# Patient Record
Sex: Female | Born: 1970 | Race: Black or African American | Hispanic: No | Marital: Single | Smoking: Current every day smoker
Health system: Southern US, Community
[De-identification: ages and names within clinical notes are randomized; demographics above are authoritative.]

## PROBLEM LIST (undated history)

## (undated) DIAGNOSIS — I10 Essential (primary) hypertension: Secondary | ICD-10-CM

## (undated) DIAGNOSIS — K219 Gastro-esophageal reflux disease without esophagitis: Secondary | ICD-10-CM

## (undated) DIAGNOSIS — E611 Iron deficiency: Secondary | ICD-10-CM

## (undated) HISTORY — DX: Essential (primary) hypertension: I10

## (undated) HISTORY — DX: Iron deficiency: E61.1

## (undated) HISTORY — PX: ABDOMINAL HYSTERECTOMY: SHX81

## (undated) HISTORY — PX: LAPAROSCOPY: SHX197

## (undated) HISTORY — DX: Gastro-esophageal reflux disease without esophagitis: K21.9

---

## 1998-09-25 ENCOUNTER — Other Ambulatory Visit: Admission: RE | Admit: 1998-09-25 | Discharge: 1998-09-25 | Payer: Self-pay | Admitting: Gynecology

## 1998-12-19 ENCOUNTER — Inpatient Hospital Stay (HOSPITAL_COMMUNITY): Admission: RE | Admit: 1998-12-19 | Discharge: 1998-12-20 | Payer: Self-pay | Admitting: Gynecology

## 1999-09-09 ENCOUNTER — Emergency Department (HOSPITAL_COMMUNITY): Admission: EM | Admit: 1999-09-09 | Discharge: 1999-09-09 | Payer: Self-pay | Admitting: Emergency Medicine

## 1999-09-09 ENCOUNTER — Encounter: Payer: Self-pay | Admitting: Emergency Medicine

## 1999-10-13 ENCOUNTER — Other Ambulatory Visit: Admission: RE | Admit: 1999-10-13 | Discharge: 1999-10-13 | Payer: Self-pay | Admitting: Gynecology

## 2000-09-30 ENCOUNTER — Inpatient Hospital Stay: Admission: AD | Admit: 2000-09-30 | Discharge: 2000-09-30 | Payer: Self-pay | Admitting: Gynecology

## 2001-06-07 ENCOUNTER — Other Ambulatory Visit: Admission: RE | Admit: 2001-06-07 | Discharge: 2001-06-07 | Payer: Self-pay | Admitting: Obstetrics & Gynecology

## 2002-12-25 ENCOUNTER — Emergency Department (HOSPITAL_COMMUNITY): Admission: EM | Admit: 2002-12-25 | Discharge: 2002-12-25 | Payer: Self-pay | Admitting: Emergency Medicine

## 2003-04-26 ENCOUNTER — Encounter: Admission: RE | Admit: 2003-04-26 | Discharge: 2003-04-26 | Payer: Self-pay | Admitting: Family Medicine

## 2003-11-16 ENCOUNTER — Encounter: Admission: RE | Admit: 2003-11-16 | Discharge: 2003-11-16 | Payer: Self-pay | Admitting: Family Medicine

## 2003-11-26 ENCOUNTER — Inpatient Hospital Stay (HOSPITAL_COMMUNITY): Admission: AD | Admit: 2003-11-26 | Discharge: 2003-11-26 | Payer: Self-pay | Admitting: Family Medicine

## 2004-01-31 ENCOUNTER — Encounter: Admission: RE | Admit: 2004-01-31 | Discharge: 2004-01-31 | Payer: Self-pay | Admitting: Obstetrics and Gynecology

## 2004-02-21 ENCOUNTER — Ambulatory Visit (HOSPITAL_COMMUNITY): Admission: RE | Admit: 2004-02-21 | Discharge: 2004-02-21 | Payer: Self-pay | Admitting: Obstetrics and Gynecology

## 2004-12-09 ENCOUNTER — Other Ambulatory Visit: Admission: RE | Admit: 2004-12-09 | Discharge: 2004-12-09 | Payer: Self-pay | Admitting: Obstetrics and Gynecology

## 2004-12-30 ENCOUNTER — Encounter: Admission: RE | Admit: 2004-12-30 | Discharge: 2004-12-30 | Payer: Self-pay | Admitting: Obstetrics and Gynecology

## 2005-01-23 ENCOUNTER — Inpatient Hospital Stay (HOSPITAL_COMMUNITY): Admission: AD | Admit: 2005-01-23 | Discharge: 2005-01-24 | Payer: Self-pay | Admitting: Obstetrics and Gynecology

## 2007-01-26 ENCOUNTER — Ambulatory Visit: Payer: Self-pay | Admitting: Gynecology

## 2007-03-10 ENCOUNTER — Ambulatory Visit: Payer: Self-pay | Admitting: Internal Medicine

## 2007-11-05 ENCOUNTER — Emergency Department (HOSPITAL_COMMUNITY): Admission: EM | Admit: 2007-11-05 | Discharge: 2007-11-05 | Payer: Self-pay | Admitting: Emergency Medicine

## 2007-11-28 ENCOUNTER — Ambulatory Visit: Payer: Self-pay | Admitting: *Deleted

## 2008-01-10 ENCOUNTER — Ambulatory Visit (HOSPITAL_COMMUNITY): Admission: RE | Admit: 2008-01-10 | Discharge: 2008-01-10 | Payer: Self-pay | Admitting: Obstetrics

## 2008-05-31 ENCOUNTER — Inpatient Hospital Stay (HOSPITAL_COMMUNITY): Admission: RE | Admit: 2008-05-31 | Discharge: 2008-06-03 | Payer: Self-pay | Admitting: Obstetrics & Gynecology

## 2008-05-31 ENCOUNTER — Encounter: Payer: Self-pay | Admitting: Obstetrics & Gynecology

## 2008-07-29 ENCOUNTER — Emergency Department (HOSPITAL_COMMUNITY): Admission: EM | Admit: 2008-07-29 | Discharge: 2008-07-29 | Payer: Self-pay | Admitting: Emergency Medicine

## 2008-10-05 ENCOUNTER — Emergency Department (HOSPITAL_COMMUNITY): Admission: EM | Admit: 2008-10-05 | Discharge: 2008-10-06 | Payer: Self-pay | Admitting: Emergency Medicine

## 2009-10-16 ENCOUNTER — Ambulatory Visit: Payer: Self-pay | Admitting: Obstetrics and Gynecology

## 2009-11-15 ENCOUNTER — Ambulatory Visit (HOSPITAL_COMMUNITY): Admission: RE | Admit: 2009-11-15 | Discharge: 2009-11-15 | Payer: Self-pay | Admitting: Obstetrics & Gynecology

## 2009-11-24 ENCOUNTER — Emergency Department (HOSPITAL_COMMUNITY): Admission: EM | Admit: 2009-11-24 | Discharge: 2009-11-24 | Payer: Self-pay | Admitting: Emergency Medicine

## 2010-01-01 ENCOUNTER — Emergency Department (HOSPITAL_COMMUNITY): Admission: EM | Admit: 2010-01-01 | Discharge: 2010-01-02 | Payer: Self-pay | Admitting: Emergency Medicine

## 2010-02-20 ENCOUNTER — Ambulatory Visit: Payer: Self-pay | Admitting: Internal Medicine

## 2010-02-21 ENCOUNTER — Ambulatory Visit: Payer: Self-pay | Admitting: Internal Medicine

## 2010-02-24 ENCOUNTER — Ambulatory Visit: Payer: Self-pay | Admitting: Internal Medicine

## 2010-02-26 ENCOUNTER — Ambulatory Visit: Payer: Self-pay | Admitting: Internal Medicine

## 2010-04-02 IMAGING — CR DG CHEST 2V
2 series · 2 of 2 positions shown · non-contrast
Comparison: 10/05/2008

CLINICAL DATA: Chest pain.  History of hypertension and smoking.

CHEST - 2 VIEW

[w chest pa]
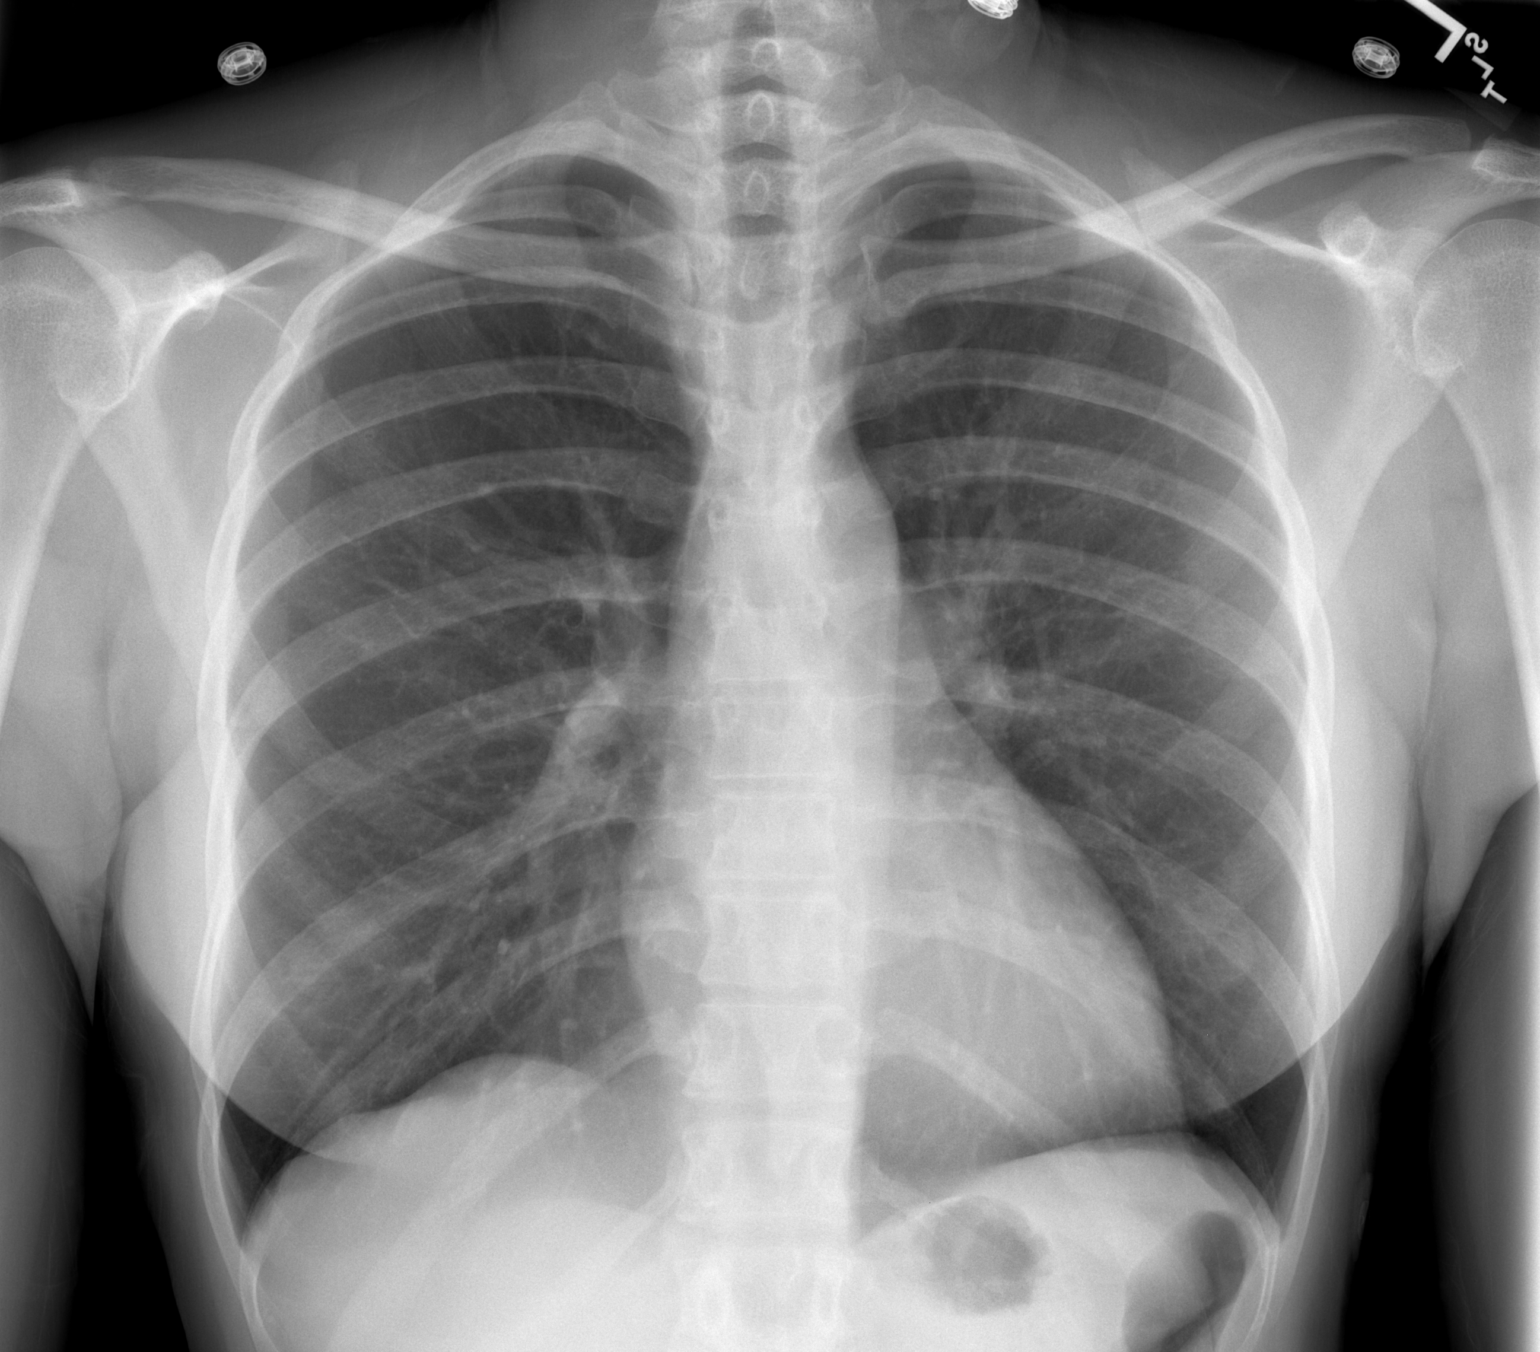

[w chest lat]
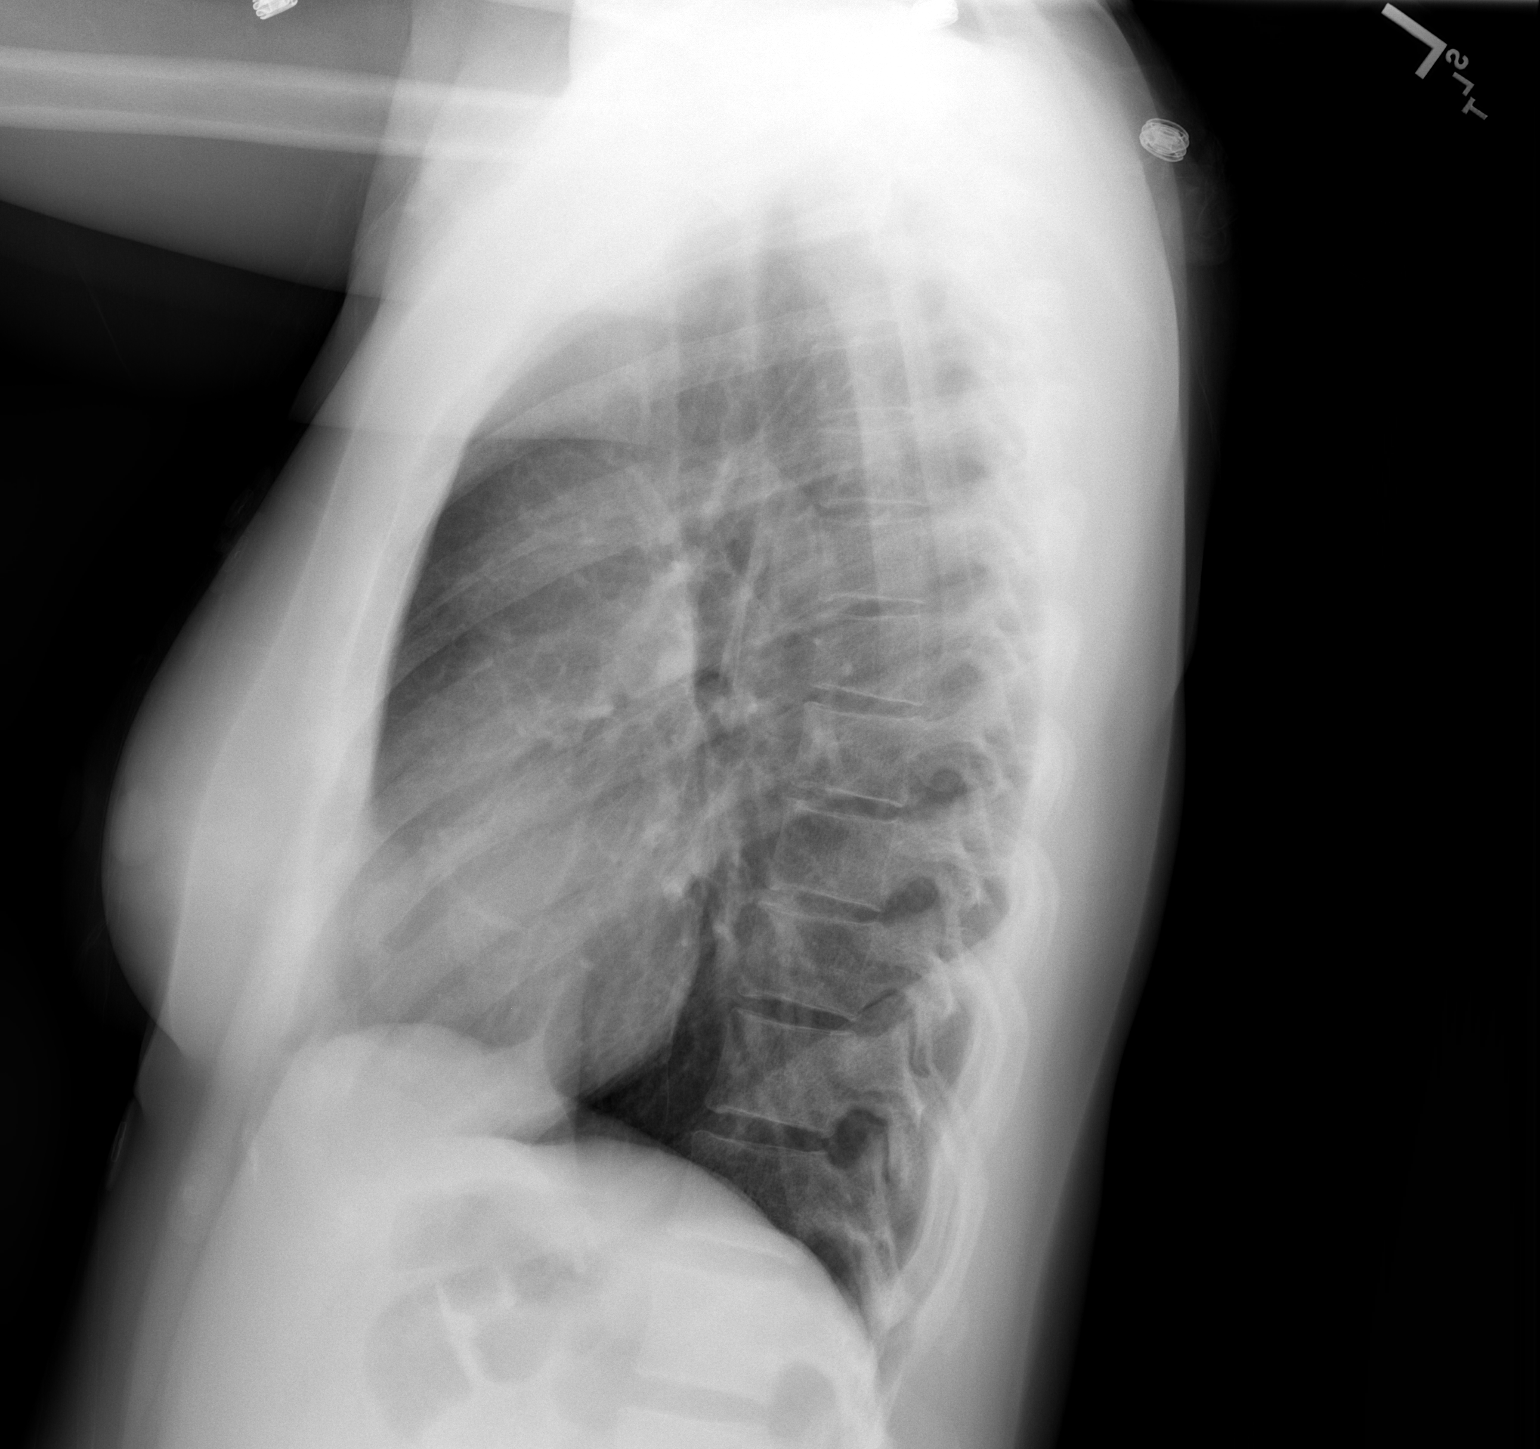

[2 of 2 positions shown; findings below may reference images not displayed]

FINDINGS: Cardiomediastinal silhouette is within normal limits.
The lungs are free of focal consolidations and pleural effusions.
Bony structures have a normal appearance.
IMPRESSION: Negative exam.

## 2010-04-28 ENCOUNTER — Ambulatory Visit: Payer: Self-pay | Admitting: Internal Medicine

## 2010-06-20 ENCOUNTER — Ambulatory Visit: Payer: Self-pay | Admitting: Internal Medicine

## 2010-09-11 ENCOUNTER — Encounter (INDEPENDENT_AMBULATORY_CARE_PROVIDER_SITE_OTHER): Payer: Self-pay | Admitting: Internal Medicine

## 2010-09-11 LAB — CONVERTED CEMR LAB
BUN: 14 mg/dL (ref 6–23)
Basophils Absolute: 0 10*3/uL (ref 0.0–0.1)
Basophils Relative: 0 % (ref 0–1)
CO2: 23 meq/L (ref 19–32)
Calcium: 9.9 mg/dL (ref 8.4–10.5)
Chloride: 103 meq/L (ref 96–112)
Creatinine, Ser: 0.62 mg/dL (ref 0.40–1.20)
Eosinophils Absolute: 0.1 10*3/uL (ref 0.0–0.7)
Eosinophils Relative: 2 % (ref 0–5)
Glucose, Bld: 104 mg/dL — ABNORMAL HIGH (ref 70–99)
HCT: 45.4 % (ref 36.0–46.0)
HIV: NONREACTIVE
Hemoglobin: 14.8 g/dL (ref 12.0–15.0)
Lymphocytes Relative: 32 % (ref 12–46)
Lymphs Abs: 2.4 10*3/uL (ref 0.7–4.0)
MCHC: 32.6 g/dL (ref 30.0–36.0)
MCV: 91.5 fL (ref 78.0–100.0)
Microalb, Ur: 1.89 mg/dL (ref 0.00–1.89)
Monocytes Absolute: 0.6 10*3/uL (ref 0.1–1.0)
Monocytes Relative: 8 % (ref 3–12)
Neutro Abs: 4.5 10*3/uL (ref 1.7–7.7)
Neutrophils Relative %: 59 % (ref 43–77)
Platelets: 235 10*3/uL (ref 150–400)
Potassium: 4.2 meq/L (ref 3.5–5.3)
RBC: 4.96 M/uL (ref 3.87–5.11)
RDW: 14.3 % (ref 11.5–15.5)
Sodium: 141 meq/L (ref 135–145)
WBC: 7.6 10*3/uL (ref 4.0–10.5)

## 2010-11-27 ENCOUNTER — Encounter (INDEPENDENT_AMBULATORY_CARE_PROVIDER_SITE_OTHER): Payer: Self-pay | Admitting: Family Medicine

## 2010-11-27 LAB — CONVERTED CEMR LAB
ALT: 33 units/L (ref 0–35)
AST: 29 units/L (ref 0–37)
Albumin: 4.7 g/dL (ref 3.5–5.2)
Alkaline Phosphatase: 115 units/L (ref 39–117)
BUN: 13 mg/dL (ref 6–23)
CO2: 28 meq/L (ref 19–32)
Calcium: 10.1 mg/dL (ref 8.4–10.5)
Chloride: 103 meq/L (ref 96–112)
Cholesterol: 237 mg/dL — ABNORMAL HIGH (ref 0–200)
Creatinine, Ser: 0.87 mg/dL (ref 0.40–1.20)
Glucose, Bld: 90 mg/dL (ref 70–99)
HDL: 53 mg/dL (ref >39)
LDL Cholesterol: 142 mg/dL — ABNORMAL HIGH (ref 0–99)
Potassium: 3.9 meq/L (ref 3.5–5.3)
Sodium: 140 meq/L (ref 135–145)
TSH: 2.743 microintl units/mL (ref 0.350–4.500)
Total Bilirubin: 0.4 mg/dL (ref 0.3–1.2)
Total CHOL/HDL Ratio: 4.5
Total Protein: 7.8 g/dL (ref 6.0–8.3)
Triglycerides: 208 mg/dL — ABNORMAL HIGH (ref <150)
VLDL: 42 mg/dL — ABNORMAL HIGH (ref 0–40)

## 2010-12-01 ENCOUNTER — Encounter: Payer: Self-pay | Admitting: Family Medicine

## 2010-12-01 ENCOUNTER — Ambulatory Visit (HOSPITAL_COMMUNITY)
Admission: RE | Admit: 2010-12-01 | Discharge: 2010-12-01 | Payer: Self-pay | Source: Home / Self Care | Attending: Family Medicine | Admitting: Family Medicine

## 2011-01-25 LAB — POCT I-STAT, CHEM 8
BUN: 6 mg/dL (ref 6–23)
Calcium, Ion: 1.15 mmol/L (ref 1.12–1.32)
Chloride: 105 mEq/L (ref 96–112)
Creatinine, Ser: 0.8 mg/dL (ref 0.4–1.2)
Glucose, Bld: 106 mg/dL — ABNORMAL HIGH (ref 70–99)
HCT: 47 % — ABNORMAL HIGH (ref 36.0–46.0)
Hemoglobin: 16 g/dL — ABNORMAL HIGH (ref 12.0–15.0)
Potassium: 3.8 mEq/L (ref 3.5–5.1)
Sodium: 136 mEq/L (ref 135–145)
TCO2: 24 mmol/L (ref 0–100)

## 2011-01-25 LAB — URINALYSIS, ROUTINE W REFLEX MICROSCOPIC
Bilirubin Urine: NEGATIVE
Glucose, UA: NEGATIVE mg/dL
Hgb urine dipstick: NEGATIVE
Ketones, ur: NEGATIVE mg/dL
Nitrite: NEGATIVE
Protein, ur: NEGATIVE mg/dL
Specific Gravity, Urine: 1.016 (ref 1.005–1.030)
Urobilinogen, UA: 0.2 mg/dL (ref 0.0–1.0)
pH: 5.5 (ref 5.0–8.0)

## 2011-01-25 LAB — POCT PREGNANCY, URINE: Preg Test, Ur: NEGATIVE

## 2011-01-30 LAB — BASIC METABOLIC PANEL
BUN: 11 mg/dL (ref 6–23)
CO2: 23 mEq/L (ref 19–32)
Calcium: 9.4 mg/dL (ref 8.4–10.5)
Chloride: 98 mEq/L (ref 96–112)
Creatinine, Ser: 0.7 mg/dL (ref 0.4–1.2)
GFR calc Af Amer: >60 mL/min (ref >60)
GFR calc non Af Amer: >60 mL/min (ref >60)
Glucose, Bld: 123 mg/dL — ABNORMAL HIGH (ref 70–99)
Potassium: 3.4 mEq/L — ABNORMAL LOW (ref 3.5–5.1)
Sodium: 129 mEq/L — ABNORMAL LOW (ref 135–145)

## 2011-01-30 LAB — POCT CARDIAC MARKERS
CKMB, poc: <1 ng/mL — ABNORMAL LOW (ref 1.0–8.0)
Myoglobin, poc: 38 ng/mL (ref 12–200)
Troponin i, poc: <0.05 ng/mL (ref 0.00–0.09)

## 2011-01-30 LAB — CBC
HCT: 45 % (ref 36.0–46.0)
Hemoglobin: 15.2 g/dL — ABNORMAL HIGH (ref 12.0–15.0)
MCHC: 33.8 g/dL (ref 30.0–36.0)
MCV: 88.8 fL (ref 78.0–100.0)
Platelets: 188 10*3/uL (ref 150–400)
RBC: 5.06 MIL/uL (ref 3.87–5.11)
RDW: 14.5 % (ref 11.5–15.5)
WBC: 4.4 10*3/uL (ref 4.0–10.5)

## 2011-01-30 LAB — DIFFERENTIAL
Basophils Absolute: 0 10*3/uL (ref 0.0–0.1)
Basophils Relative: 0 % (ref 0–1)
Eosinophils Absolute: 0 10*3/uL (ref 0.0–0.7)
Eosinophils Relative: 1 % (ref 0–5)
Lymphocytes Relative: 25 % (ref 12–46)
Lymphs Abs: 1.1 10*3/uL (ref 0.7–4.0)
Monocytes Absolute: 0.4 10*3/uL (ref 0.1–1.0)
Monocytes Relative: 8 % (ref 3–12)
Neutro Abs: 2.9 10*3/uL (ref 1.7–7.7)
Neutrophils Relative %: 66 % (ref 43–77)

## 2011-03-04 ENCOUNTER — Emergency Department (HOSPITAL_COMMUNITY): Payer: Self-pay

## 2011-03-04 ENCOUNTER — Emergency Department (HOSPITAL_COMMUNITY)
Admission: EM | Admit: 2011-03-04 | Discharge: 2011-03-04 | Disposition: A | Discharge status: Home / Self Care | Payer: Self-pay | Attending: Emergency Medicine | Admitting: Emergency Medicine

## 2011-03-04 DIAGNOSIS — R51 Headache: Secondary | ICD-10-CM | POA: Insufficient documentation

## 2011-03-04 DIAGNOSIS — E876 Hypokalemia: Secondary | ICD-10-CM | POA: Insufficient documentation

## 2011-03-04 DIAGNOSIS — M79609 Pain in unspecified limb: Secondary | ICD-10-CM | POA: Insufficient documentation

## 2011-03-04 DIAGNOSIS — I10 Essential (primary) hypertension: Secondary | ICD-10-CM | POA: Insufficient documentation

## 2011-03-04 DIAGNOSIS — R110 Nausea: Secondary | ICD-10-CM | POA: Insufficient documentation

## 2011-03-04 DIAGNOSIS — M542 Cervicalgia: Secondary | ICD-10-CM | POA: Insufficient documentation

## 2011-03-04 DIAGNOSIS — H538 Other visual disturbances: Secondary | ICD-10-CM | POA: Insufficient documentation

## 2011-03-04 LAB — CBC
HCT: 42.2 % (ref 36.0–46.0)
Hemoglobin: 14 g/dL (ref 12.0–15.0)
MCH: 28.2 pg (ref 26.0–34.0)
MCHC: 33.2 g/dL (ref 30.0–36.0)
MCV: 84.9 fL (ref 78.0–100.0)
Platelets: 204 10*3/uL (ref 150–400)
RBC: 4.97 MIL/uL (ref 3.87–5.11)
RDW: 13.7 % (ref 11.5–15.5)
WBC: 5 10*3/uL (ref 4.0–10.5)

## 2011-03-04 LAB — BASIC METABOLIC PANEL
BUN: 12 mg/dL (ref 6–23)
CO2: 27 mEq/L (ref 19–32)
Calcium: 9.1 mg/dL (ref 8.4–10.5)
Chloride: 97 mEq/L (ref 96–112)
Creatinine, Ser: 0.8 mg/dL (ref 0.4–1.2)
GFR calc Af Amer: >60 mL/min (ref >60)
GFR calc non Af Amer: >60 mL/min (ref >60)
Glucose, Bld: 138 mg/dL — ABNORMAL HIGH (ref 70–99)
Potassium: 2.9 mEq/L — ABNORMAL LOW (ref 3.5–5.1)
Sodium: 133 mEq/L — ABNORMAL LOW (ref 135–145)

## 2011-03-04 LAB — SEDIMENTATION RATE: Sed Rate: 13 mm/hr (ref 0–22)

## 2011-03-04 LAB — DIFFERENTIAL
Basophils Absolute: 0 10*3/uL (ref 0.0–0.1)
Basophils Relative: 0 % (ref 0–1)
Eosinophils Absolute: 0 10*3/uL (ref 0.0–0.7)
Eosinophils Relative: 0 % (ref 0–5)
Lymphocytes Relative: 34 % (ref 12–46)
Lymphs Abs: 1.7 10*3/uL (ref 0.7–4.0)
Monocytes Absolute: 0.5 10*3/uL (ref 0.1–1.0)
Monocytes Relative: 9 % (ref 3–12)
Neutro Abs: 2.9 10*3/uL (ref 1.7–7.7)
Neutrophils Relative %: 57 % (ref 43–77)

## 2011-03-04 LAB — CK TOTAL AND CKMB (NOT AT ARMC)
CK, MB: 0.9 ng/mL (ref 0.3–4.0)
Relative Index: 0.8 (ref 0.0–2.5)
Total CK: 119 U/L (ref 7–177)

## 2011-03-04 LAB — D-DIMER, QUANTITATIVE: D-Dimer, Quant: 0.74 ug/mL-FEU — ABNORMAL HIGH (ref 0.00–0.48)

## 2011-03-05 ENCOUNTER — Ambulatory Visit (HOSPITAL_COMMUNITY)
Admission: RE | Admit: 2011-03-05 | Discharge: 2011-03-05 | Disposition: A | Discharge status: Home / Self Care | Payer: Self-pay | Source: Ambulatory Visit | Attending: Emergency Medicine | Admitting: Emergency Medicine

## 2011-03-05 DIAGNOSIS — M79609 Pain in unspecified limb: Secondary | ICD-10-CM | POA: Insufficient documentation

## 2011-03-05 DIAGNOSIS — M7989 Other specified soft tissue disorders: Secondary | ICD-10-CM | POA: Insufficient documentation

## 2011-03-24 NOTE — H&P (Signed)
NAME:  Kaylee Allison, ROEHRICH NO.:  [id number]   MEDICAL RECORD NO.:  [id number]          PATIENT TYPE:  INO   LOCATION:                                [facility]   PHYSICIAN:  Roseanna Rainbow, M.D.DATE OF BIRTH:  14-Jun-1971   DATE OF ADMISSION:  05/31/2008  DATE OF DISCHARGE:                              HISTORY & PHYSICAL   CHIEF COMPLAINT:  The patient is a 40 year old with symptomatic uterine  fibroids and bilateral hydrosalpinges who presents for a total abdominal  hysterectomy and bilateral salpingectomies.   HISTORY OF PRESENT ILLNESS:  The patient has a long history of  symptomatic uterine fibroids.  She has a history of multiple abdominal  myomectomies.  She has heavy menses and secondary dysmenorrhea.  She  also has mild-to-moderate secondary iron-deficiency anemia.  Hemoglobin  in February 2009 was 9.8.  Workup to date has included a normal TSH.  Pap smear in February 2009 was negative.  Pelvic ultrasound performed in  March 2009; this demonstrated a uterus measuring approximately 14.9 cm  in length.  There were numerous fibroids.  The endometrial stripe did  not appear grossly thickened.  The largest fibroids included a broad-  based subserosal myoma at the fundus and a lower uterine segment myoma  to the left of the midline.  Bilateral hydrosalpinges were noted as  well.   PAST MEDICAL HISTORY:  1. Hyperthyroidism.  2. Iron-deficiency anemia.  3. Chronic hypertension.   PAST SURGICAL HISTORY:  Please see the above.   SOCIAL HISTORY:  She is single, living with her significant other.  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  She drinks a moderate amount of alcoholic beverages.  Denies  illicit drug use.   FAMILY HISTORY:  Congestive heart failure, adult-onset diabetes,  hypertension, kidney disease, and migraine headaches.   PAST GYN HISTORY:  Please see the above.   ALLERGIES:   CODEINE.   PHYSICAL EXAMINATION:  VITAL SIGNS:  Temperature 98.7, pulse 96, blood  pressure 139/78, weight 114 pounds, and height 5 feet 6 inches.  GENERAL:  Well developed, well nourished, and in no apparent distress.  LUNGS:  Clear to auscultation bilaterally.  HEART:  Regular rate and rhythm.  ABDOMEN:  There is a mass arising from the pelvis to approximately 3  fingerbreadths below the umbilicus, it is mobile.  PELVIC:  Normal EGBUS.  The cervix is without lesions.  The bimanual  exam is limited by the markedly enlarged uterus.   ASSESSMENT:  Symptomatic uterine fibroids, bilateral hydrosalpinges.   PLAN:  The planned procedure is total abdominal hysterectomy and  bilateral salpingectomies.  The risks, benefits, and alternative forms  of management have been reviewed with the patient and informed consent  had been obtained.      Roseanna Rainbow, M.D.  Electronically Signed     LAJ/MEDQ  D:  05/30/2008  T:  05/31/2008  Job:  [id number]

## 2011-03-24 NOTE — Op Note (Signed)
NAME:  Kaylee Allison, Kaylee Allison NO.:  [id number]   MEDICAL RECORD NO.:  [id number]          PATIENT TYPE:  INP   LOCATION:  9302                          [facility]ACILITY:  [facility]   PHYSICIAN:  Roseanna Rainbow, M.D.DATE OF BIRTH:  1971/01/28   DATE OF PROCEDURE:  05/31/2008  DATE OF DISCHARGE:                               OPERATIVE REPORT   PREOPERATIVE DIAGNOSES:  1. Symptomatic uterine fibroids.  2. Bilateral hydrosalpinges.   POSTOPERATIVE DIAGNOSES:  1. Symptomatic uterine fibroids.  2. Bilateral endometriomas.  3. Bilateral hematosalpinges.  4. Adhesions.   PROCEDURES:  Total abdominal hysterectomy, bilateral salpingo-  oophorectomy, and lysis of adhesions.   SURGEONS:  1. Roseanna Rainbow, MD  2. Charles A. Clearance Coots, MD   ANESTHESIA:  General endotracheal.   PATHOLOGY:  Uterus, cervix, tubes, and ovaries.   ESTIMATED BLOOD LOSS:  1 liter.   COMPLICATIONS:  Increased blood loss.   FINDINGS:  The uterus was diffusely enlarged to approximately 16 cm in  length with irregular contour.  There were adhesions involving both the  anterior and posterior cul-de-sac.  The ovaries and tubes were involved  in adhesions bilaterally.  Both ovaries had chocolate cysts that were  ruptured during the procedure.  The tubes had blood-tinged fluid as  well.  The appendix was involved in adhesions to the right adnexa.   PROCEDURE:  The patient was taken to the operating room with an IV  running.  She was given general anesthesia and placed in a dorsal  lithotomy position.  She was then prepped and draped in the usual  sterile fashion.  After a time-out had been completed, the previous  Pfannenstiel incision scar was then incised with a scalpel and carried  down to the underlying fascia.  The fascia was nicked in the midline.  The fascial incision was then extended bilaterally.  The superior aspect  of the fascial incision was tented up and the underlying rectus muscles  dissected off.  The inferior aspect of the fascial incision was  manipulated in a similar fashion.  The rectus muscles were separated in  the midline.  The parietal peritoneum was tented up and entered sharply.  This incision was then extended superiorly and inferiorly with good  visualization of the bladder.  There were omental adhesions involving  the parietal peritoneum of the anterior abdominal wall.  These were  divided with cautery.  There were also omental adhesions to the serosa  overlying the fundal myomas.  This portion of the omentum was  sequentially clamped with Kelly clamps and divided.  The pedicles were  secured with 0-Vicryl ties.  The adhesions involving the anterior cul-de-  sac were then sharply and bluntly dissected.  The bowel was then packed  away and Deavers were then placed into the incision for retraction.  The  round ligaments on both sides were then divided with cautery.  The broad  ligament was then transected to the midline using the Bovie, and the  bladder was then dissected off the lower uterine segment using both  sharp and blunt dissection.  The utero-ovarian ligaments and the  fallopian tubes were then doubly clamped bilaterally and transected.  The pedicles were then secured with free ligatures of 0-Vicryl.  The  uterine vessels were then skeletonized bilaterally.  The vessels were  then clamped with parametrial clamps.  The pedicles were then secured  with suture ligatures of 0-Vicryl.  The uterine fundus was then  amputated at this point above the level of the uterine pedicles.  At  this point, an O'Connor-O'Sullivan retractor was then placed into the  incision and the bowel was repacked with moistened laparotomy sponges.  The infundibulopelvic ligaments on both sides were then skeletonized.  The ureter was visualized prior to clamping the infundibulopelvic  ligaments.  The pedicles were doubly clamped and transected, and both  free ligature and suture  ligatures were used to secure the pedicles.  The adhesions noted above involving the appendix to the right adnexa  were divided using cautery.  The appendix was not felt to be involved in  endometriosis.  please note that the remainder of the peritoneal  surfaces appeared free of any active endometriotic lesions.  There were  adhesions to the parietal peritoneum of the pelvis involving the ovaries  and tubes, and these adhesions were carefully sharply dissected taking  care to avoid the ureter.  The cardinal ligaments were then clamped with  parametrial clamps, transected, and secured with suture ligatures.  The  cervix was then amputated.  The vaginal angles were then secured with  suture ligatures of 0-Vicryl.  The remainder of the vaginal cuff was  reapproximated with figure-of-eight sutures of 0-Vicryl.  Adequate  hemostasis was noted.  At this point in the procedure, there was  hematuria noted.  The bladder was then filled with sterile milk, and  there was no leakage of the milk noted.  The pelvis was copiously  irrigated.  Seprafilm was then placed into the pelvis.  The retractors,  instruments, and lap pads were then removed.  The parietal peritoneum  was then reapproximated in a running fashion using 2-0 Vicryl.  The  fascia was closed with running sutures of 0-PDS.  The skin was closed in  a subcuticular fashion with 3-0 Monocryl.  At the closure of the  procedure, the instrument and pack counts were said to be correct x2.  The patient was taken to the PACU, awake, and in stable condition.      Roseanna Rainbow, M.D.  Electronically Signed     LAJ/MEDQ  D:  05/31/2008  T:  06/01/2008  Job:  [id number]

## 2011-03-27 NOTE — Discharge Summary (Signed)
NAME:  Kaylee Allison, Kaylee Allison NO.:  [id number]   MEDICAL RECORD NO.:  [id number]          PATIENT TYPE:  INP   LOCATION:  9302                          [facility]ACILITY:  [facility]   PHYSICIAN:  Roseanna Rainbow, M.D.DATE OF BIRTH:  [birth year]   DATE OF ADMISSION:  05/31/2008  DATE OF DISCHARGE:  06/03/2008                               DISCHARGE SUMMARY   CHIEF COMPLAINT:  The patient is a 40 year old with symptomatic uterine  fibroids and bilateral hydrosalpinges who presents for a total abdominal  hysterectomy and bilateral salpingectomies.  Please see the dictated  history and physical for further details.   HOSPITAL COURSE:  The patient was admitted.  She underwent a total  abdominal hysterectomy, bilateral salpingo-oophorectomy, and lysis of  adhesions.  Please see the see the dictated operative summary.  On  postoperative day #1, her hemoglobin was 9.3 and on postoperative day #2  she was complaining of left lateral thigh numbness.  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  The remainder of her hospital course was uneventful.  She  was discharged to home on postoperative day #3 tolerating a regular  diet.   DISCHARGE DIAGNOSES:  Leiomyomata, endometriosis, and adhesions.   PROCEDURES:  Total abdominal hysterectomy, bilateral salpingo-  oophorectomy, and lysis of adhesions.   CONDITION:  Stable.   DIET:  Regular.   ACTIVITY:  Progressive activity, pelvic rest.   MEDICATIONS:  Please see the reconciliation form.   DISPOSITION:  The patient was to follow up in the office in 2 weeks.      Roseanna Rainbow, M.D.  Electronically Signed     LAJ/MEDQ  D:  06/22/2008  T:  06/23/2008  Job:  [id number]

## 2011-03-27 NOTE — Group Therapy Note (Signed)
NAME:  Kaylee Allison, Kaylee Allison NO.:  [id number]   MEDICAL RECORD NO.:  [id number]                   PATIENT TYPE:  OUT   LOCATION:  [facility]                           FACILITY:  [facility]   PHYSICIAN:  Tinnie Gens, MD                     DATE OF BIRTH:  02/04/1971   DATE OF SERVICE:  11/16/2003                                    CLINIC NOTE   CHIEF COMPLAINT:  Enlarged uterus.   HISTORY OF PRESENT ILLNESS:  The patient is a 40 year old G0 who has been  seen in this office previously for fibroid uterus.  Apparently her uterus  was felt to have grown in size to 14- to 16-week size and she was referred  again.  The patient continues to have complaints related to her fibroids  including heavy vaginal bleeding with periods as well as painful periods.  She has pain with intercourse.   Today she is also complaining of vaginal discharge, burning, and odor.  She  reports that this happens frequently during the month.  She does have  occasional condom use but she notes it mostly after her menses.  Her  previous ultrasound is reviewed, showed three fibroids, the largest of which  was about 4 cm wide and they were in various locations.   PHYSICAL EXAMINATION TODAY:  VITAL SIGNS:  Weight is 110, blood pressure  119/74.  GENERAL:  Well-developed, well-nourished black female in no acute distress.  She is thin.  GENITOURINARY:  Shows normal external female genitalia.  The vagina is  rugated.  There is no significant discharge.  Moderate amount of blood  there.  The cervix is angled downward.  There is a large anterior fibroid  that is probably 5-6 cm that is anterior and disrupting the normal uterine  contour.  There is also another fibroid that is probably 5 cm that can be  felt fundally, and another fibroid felt on the patient's left side.  The  uterus is approximately 14-week size today.  Wet prep showed occasional clue  cells, no Trich, no yeast.   IMPRESSION:  1.  Fibroid uterus.  2. Bacterial vaginitis.   PLAN:  Discussed all options with the patient again today.  Again, we are  going to try watchful waiting.  Will repeat her ultrasound and see what that  shows and probably repeat every 6 months to see how these are growing.  A  trial of Naprosyn for premenstrual symptoms as well as through menses.  Flagyl 500 b.i.d. for 7 days to treat her infection.  She has several  refills of this just in case she has recurrence.                                               Tinnie Gens, MD  TP/MEDQ  D:  11/16/2003  T:  11/16/2003  Job:  [id number]

## 2011-03-27 NOTE — Group Therapy Note (Signed)
NAME:  Kaylee Allison, DEMARIO NO.:  [id number]   MEDICAL RECORD NO.:  [id number]                   PATIENT TYPE:  OUT   LOCATION:  [facility]                           FACILITY:  [facility]   PHYSICIAN:  Tinnie Gens, MD                     DATE OF BIRTH:  [birth year]   DATE OF SERVICE:  01/31/2004                                    CLINIC NOTE   CHIEF COMPLAINT:  Follow-up.   HISTORY OF PRESENT ILLNESS:  The patient is a 40 year old G0 who has been  evaluated for fibroids.  It was felt that her fibroids had possibly grown in  size.  She continues to have some abnormal bleeding.  On her previous  ultrasound she was felt to have an ovarian cyst and she would like that  followed up.  The patient desires a repeat ultrasound to relook at her  fibroids as well as the ovarian cyst.  She still does not want any  significant treatment.  On exam, the uterus feels approximately the same  size as it did previously.  The same fibroids are noted.   IMPRESSION:  1. Fibroid uterus.  2. Right ovarian cyst.   PLAN:  Follow-up ultrasound.  The patient will return after this ultrasound  to discuss treatment options.                                               Tinnie Gens, MD    TP/MEDQ  D:  02/27/2004  T:  02/27/2004  Job:  [id number]

## 2011-04-01 ENCOUNTER — Encounter: Payer: Self-pay | Admitting: Cardiology

## 2011-04-02 ENCOUNTER — Encounter: Payer: Self-pay | Admitting: Cardiology

## 2011-04-02 ENCOUNTER — Ambulatory Visit (INDEPENDENT_AMBULATORY_CARE_PROVIDER_SITE_OTHER): Payer: Self-pay | Admitting: Cardiology

## 2011-04-02 VITALS — BP 128/81 | HR 87 | Resp 14 | Ht 64.0 in | Wt 142.0 lb

## 2011-04-02 DIAGNOSIS — Z720 Tobacco use: Secondary | ICD-10-CM | POA: Insufficient documentation

## 2011-04-02 DIAGNOSIS — F17200 Nicotine dependence, unspecified, uncomplicated: Secondary | ICD-10-CM

## 2011-04-02 DIAGNOSIS — R079 Chest pain, unspecified: Secondary | ICD-10-CM | POA: Insufficient documentation

## 2011-04-02 DIAGNOSIS — R072 Precordial pain: Secondary | ICD-10-CM

## 2011-04-02 NOTE — Assessment & Plan Note (Signed)
Patient counseled on discontinuing. 

## 2011-04-02 NOTE — Patient Instructions (Signed)
You are being scheduled for a Stress Echo test.  Please follow the instruction sheet given. Follow up will be based on the results of your testing. Please continue any medication you presently take.

## 2011-04-02 NOTE — Progress Notes (Signed)
HPI: 40 yo female with no prior cardiac history for evaluation of chest pain. Patient states that she has had intermittent chest pain for approximately one year. It is substernal and described as a pressure. It radiates to the back. It lasts 15 minutes and resolves spontaneously. There are no associated symptoms. It is not exertional, pleuritic, positional or related to food. It resolves spontaneously. She also has dyspnea on exertion and pedal edema. Because of the above we were asked to further evaluate.  Current Outpatient Prescriptions  Medication Sig Dispense Refill  . aspirin 81 MG tablet Take 81 mg by mouth daily.        . famotidine (PEPCID) 20 MG tablet Take 20 mg by mouth daily.        . hydrochlorothiazide 25 MG tablet Take 25 mg by mouth daily.        Marland Kitchen ibuprofen (ADVIL,MOTRIN) 600 MG tablet Take 600 mg by mouth as needed.        Marland Kitchen losartan (COZAAR) 50 MG tablet Take 50 mg by mouth daily.        . methocarbamol (ROBAXIN) 750 MG tablet Take 750 mg by mouth as needed.        . Omega-3 Fatty Acids (FISH OIL) 1000 MG CAPS 1 tab po qd       . traMADol-acetaminophen (ULTRACET) 37.5-325 MG per tablet Take 1 tablet by mouth every 6 (six) hours as needed.          Allergies  Allergen Reactions  . Codeine     Past Medical History  Diagnosis Date  . Iron deficiency   . Hypertension     chronic  . GERD (gastroesophageal reflux disease)     Past Surgical History  Procedure Date  . Abdominal hysterectomy   . Laparoscopy     History   Social History  . Marital Status: Single    Spouse Name: N/A    Number of Children: N/A  . Years of Education: N/A   Occupational History  .      Unemployed   Social History Main Topics  . Smoking status: Current Everyday Smoker    Types: Cigarettes  . Smokeless tobacco: Not on file  . Alcohol Use: Yes     2 beers per day  . Drug Use: Yes     Marijuana  . Sexually Active: Not on file   Other Topics Concern  . Not on file   Social  History Narrative  . No narrative on file    Family History  Problem Relation Age of Onset  . Cancer    . Diabetes    . Hypertension      ROS: no fevers or chills, productive cough, hemoptysis, dysphasia, odynophagia, melena, hematochezia, dysuria, hematuria, rash, seizure activity, orthopnea, PND, pedal edema, claudication. Remaining systems are negative.  Physical Exam: General:  Well developed/well nourished in NAD Skin warm/dry Patient not depressed No peripheral clubbing Back-normal HEENT-normal/normal eyelids Neck supple/normal carotid upstroke bilaterally; no bruits; no JVD; no thyromegaly chest - CTA/ normal expansion CV - RRR/normal S1 and S2; no murmurs, rubs or gallops;  PMI nondisplaced Abdomen -NT/ND, no HSM, no mass, + bowel sounds, no bruit 2+ femoral pulses, no bruits Ext-no edema, chords, 2+ DP Neuro-grossly nonfocal  ECG Sinus rhythm at a rate of 87. Axis normal. Nonspecific ST changes.

## 2011-04-02 NOTE — Assessment & Plan Note (Signed)
Symptoms atypical. Schedule stress echocardiogram both to rule out ischemia and quantify LV function.

## 2011-04-16 ENCOUNTER — Ambulatory Visit (HOSPITAL_COMMUNITY): Payer: Self-pay | Attending: Cardiology | Admitting: Radiology

## 2011-04-16 DIAGNOSIS — F17200 Nicotine dependence, unspecified, uncomplicated: Secondary | ICD-10-CM | POA: Insufficient documentation

## 2011-04-16 DIAGNOSIS — R0989 Other specified symptoms and signs involving the circulatory and respiratory systems: Secondary | ICD-10-CM | POA: Insufficient documentation

## 2011-04-16 DIAGNOSIS — R072 Precordial pain: Secondary | ICD-10-CM | POA: Insufficient documentation

## 2011-04-16 DIAGNOSIS — R5381 Other malaise: Secondary | ICD-10-CM | POA: Insufficient documentation

## 2011-04-16 DIAGNOSIS — R0609 Other forms of dyspnea: Secondary | ICD-10-CM | POA: Insufficient documentation

## 2011-04-16 DIAGNOSIS — M7989 Other specified soft tissue disorders: Secondary | ICD-10-CM | POA: Insufficient documentation

## 2011-08-07 LAB — BASIC METABOLIC PANEL
BUN: <1 — ABNORMAL LOW
CO2: 27
Calcium: 8.4
Chloride: 105
Creatinine, Ser: 0.61
GFR calc Af Amer: >60
GFR calc non Af Amer: >60
Glucose, Bld: 165 — ABNORMAL HIGH
Potassium: 3.8
Sodium: 135

## 2011-08-07 LAB — CBC
HCT: 24.5 — ABNORMAL LOW
HCT: 29 — ABNORMAL LOW
HCT: 38.7
Hemoglobin: 12.5
Hemoglobin: 7.9 — CL
Hemoglobin: 9.3 — ABNORMAL LOW
MCHC: 32.1
MCHC: 32.2
MCHC: 32.3
MCV: 86.9
MCV: 87.7
MCV: 88.2
Platelets: 142 — ABNORMAL LOW
Platelets: 182
Platelets: 222
RBC: 2.79 — ABNORMAL LOW
RBC: 3.29 — ABNORMAL LOW
RBC: 4.45
RDW: 16.7 — ABNORMAL HIGH
RDW: 16.8 — ABNORMAL HIGH
RDW: 17.5 — ABNORMAL HIGH
WBC: 10.8 — ABNORMAL HIGH
WBC: 14.8 — ABNORMAL HIGH
WBC: 6.6

## 2011-08-07 LAB — PREGNANCY, URINE: Preg Test, Ur: NEGATIVE

## 2011-08-07 LAB — TYPE AND SCREEN
ABO/RH(D): O POS
Antibody Screen: NEGATIVE

## 2011-08-07 LAB — ABO/RH: ABO/RH(D): O POS

## 2011-08-10 LAB — POCT I-STAT, CHEM 8
BUN: 11
Calcium, Ion: 1.19
Chloride: 103
Creatinine, Ser: 0.8
Glucose, Bld: 99
HCT: 45
Hemoglobin: 15.3 — ABNORMAL HIGH
Potassium: 4.7
Sodium: 135
TCO2: 27

## 2011-08-12 LAB — COMPREHENSIVE METABOLIC PANEL
ALT: 18
AST: 23
Albumin: 3.3 — ABNORMAL LOW
Alkaline Phosphatase: 74
BUN: 10
CO2: 25
Calcium: 8.9
Chloride: 110
Creatinine, Ser: 0.63
GFR calc Af Amer: >60
GFR calc non Af Amer: >60
Glucose, Bld: 86
Potassium: 3.9
Sodium: 139
Total Bilirubin: 0.3
Total Protein: 6.4

## 2011-08-12 LAB — URINALYSIS, ROUTINE W REFLEX MICROSCOPIC
Bilirubin Urine: NEGATIVE
Glucose, UA: NEGATIVE
Hgb urine dipstick: NEGATIVE
Ketones, ur: NEGATIVE
Nitrite: NEGATIVE
Protein, ur: NEGATIVE
Specific Gravity, Urine: 1.026
Urobilinogen, UA: 0.2
pH: 5.5

## 2011-08-12 LAB — DIFFERENTIAL
Basophils Absolute: 0
Basophils Relative: 0
Eosinophils Absolute: 0.1
Eosinophils Relative: 2
Lymphocytes Relative: 30
Lymphs Abs: 2
Monocytes Absolute: 0.9
Monocytes Relative: 13 — ABNORMAL HIGH
Neutro Abs: 3.7
Neutrophils Relative %: 55

## 2011-08-12 LAB — CBC
HCT: 37.2
Hemoglobin: 12.1
MCHC: 32.5
MCV: 87
Platelets: 222
RBC: 4.27
RDW: 20 — ABNORMAL HIGH
WBC: 6.8

## 2011-08-12 LAB — RETICULOCYTES
RBC.: 4.16
Retic Count, Absolute: 54.1
Retic Ct Pct: 1.3

## 2011-08-12 LAB — POCT CARDIAC MARKERS
CKMB, poc: 1.2
Myoglobin, poc: 33.9
Troponin i, poc: <0.05

## 2011-08-12 LAB — D-DIMER, QUANTITATIVE: D-Dimer, Quant: 0.23

## 2011-11-12 ENCOUNTER — Other Ambulatory Visit (HOSPITAL_COMMUNITY): Payer: Self-pay | Admitting: Family Medicine

## 2011-11-12 DIAGNOSIS — Z1231 Encounter for screening mammogram for malignant neoplasm of breast: Secondary | ICD-10-CM

## 2011-12-08 ENCOUNTER — Ambulatory Visit (HOSPITAL_COMMUNITY)
Admission: RE | Admit: 2011-12-08 | Discharge: 2011-12-08 | Disposition: A | Discharge status: Home / Self Care | Payer: Self-pay | Source: Ambulatory Visit | Attending: Family Medicine | Admitting: Family Medicine

## 2011-12-08 DIAGNOSIS — Z1231 Encounter for screening mammogram for malignant neoplasm of breast: Secondary | ICD-10-CM | POA: Insufficient documentation

## 2012-10-10 ENCOUNTER — Emergency Department (HOSPITAL_COMMUNITY)
Admission: EM | Admit: 2012-10-10 | Discharge: 2012-10-10 | Disposition: A | Discharge status: Home / Self Care | Payer: Self-pay | Source: Home / Self Care

## 2012-10-10 ENCOUNTER — Encounter (HOSPITAL_COMMUNITY): Payer: Self-pay

## 2012-10-10 DIAGNOSIS — K219 Gastro-esophageal reflux disease without esophagitis: Secondary | ICD-10-CM

## 2012-10-10 MED ORDER — CALCIUM CARBONATE-SIMETHICONE 500-125 MG PO CHEW: 1.0000 | CHEWABLE_TABLET | Freq: Three times a day (TID) | ORAL | Status: DC | PRN

## 2012-10-10 MED ORDER — LOSARTAN POTASSIUM 50 MG PO TABS: 50.0000 mg | ORAL_TABLET | Freq: Every day | ORAL | Status: DC

## 2012-10-10 MED ORDER — HYDROCHLOROTHIAZIDE 25 MG PO TABS: 25.0000 mg | ORAL_TABLET | Freq: Every day | ORAL | Status: DC

## 2012-10-10 MED ORDER — OMEPRAZOLE MAGNESIUM 20 MG PO TBEC: 20.0000 mg | DELAYED_RELEASE_TABLET | Freq: Every day | ORAL | Status: DC

## 2012-10-10 MED ORDER — ASPIRIN 81 MG PO TABS: 81.0000 mg | ORAL_TABLET | Freq: Every day | ORAL | Status: DC

## 2012-10-10 NOTE — ED Provider Notes (Signed)
History     CSN: [id number]  Arrival date & time 10/10/12  1518   First MD Initiated Contact with Patient 10/10/12 1530      Chief Complaint  Patient presents with  . Medication Refill  . Epigastric pan x 3 weeks         HPI  41 year old [place] American female with history of hypertension, active tobacco use, who presents for medication refill as well as symptoms of epigastric discomfort for about 3 weeks. She describes these symptoms as a burning pain over the epigastric area which is nonradiating and associated with being on empty stomach I am. Patient had history of GERD in the past and was taking famotidine with some relief. She denies any nausea or vomiting but does have some acid reflux. She informs taking off and on ibuprofen over-the-counter for her shoulder pain ( takes about 2 tablets every other day). Actively smokes 1 pack per day. She informs drinking  a couple of beers over the weekend only. Also ran out of her losartan about 4 weeks back.  needs refill for her aspirin and HCTZ as well. Eyes any chest pain, palpitations, shortness of breath, headache, blurry vision, fever, chills, bowel or urinary symptoms, weakness or numbness.  Past Medical History  Diagnosis Date  . Iron deficiency   . Hypertension     chronic  . GERD (gastroesophageal reflux disease)     Past Surgical History  Procedure Date  . Abdominal hysterectomy   . Laparoscopy     Family History  Problem Relation Age of Onset  . Cancer    . Diabetes    . Hypertension      History  Substance Use Topics  . Smoking status: Current Every Day Smoker ( 1PPD)    Types: Cigarettes  . Smokeless tobacco: Not on file  . Alcohol Use: Yes     : 2 beers per weekend    OB History    Grav Para Term Preterm Abortions TAB SAB Ect Mult Living                  Review of Systems] Denies Headache, blurry vision, eye pain, ear pain, fever  , chills Denies shortness of breath, cough, sore throat, hoarseness  of voice, Chest pain, palpitations, dyspnea on exertion Denies, nausea, diarrhea, vomiting, abdominal bloating, or urinary symptoms Denies muscle weakness, joint or back pain, tingling or numbness of extremities  Allergies  Codeine  Home Medications   Current Outpatient Rx  Name  Route  Sig  Dispense  Refill  . ASPIRIN 81 MG PO TABS   Oral   Take 1 tablet (81 mg total) by mouth daily.   30 tablet   5   . CALCIUM CARBONATE-SIMETHICONE 500-125 MG PO CHEW   Oral   Chew 1 tablet by mouth 3 (three) times daily as needed (indigestion).   30 each   0   . HYDROCHLOROTHIAZIDE 25 MG PO TABS   Oral   Take 1 tablet (25 mg total) by mouth daily.   30 tablet   5   . IBUPROFEN 600 MG PO TABS   Oral   Take 600 mg by mouth as needed.           Marland Kitchen LOSARTAN POTASSIUM 50 MG PO TABS   Oral   Take 1 tablet (50 mg total) by mouth daily.   30 tablet   5   . OMEPRAZOLE MAGNESIUM 20 MG PO TBEC   Oral  Take 1 tablet (20 mg total) by mouth daily.   30 tablet   0     BP 152/82  Pulse 79  Temp 98.9 F (37.2 C) (Oral)  Resp 19  SpO2 100%  Physical Exam Related female in no acute distress HEENT: No pallor, moist oral mucosa, no icterus Chest: Clear to auscultation bilaterally, no added sounds CVS: Normal S1 and S2, no murmurs Abdomen: Soft, mildly tender to palpation over epigastric area, nondistended, bowel sounds present Ext:: Warm, no edema CNS: AAO x3, nonfocal  ED Course  Procedures (including critical care time)  Labs Reviewed - No data to display No results found.   1. GERD (gastroesophageal reflux disease)    Symptoms are indicative of GERD symptoms which is worsened with active heavy smoking and OTC motrin use. Will prescribe a 1 month course of prilosec. Will also prescribe prn maalox. counseled Strongly on smoking cessation and minimize use off NSAIDs. Also counseled on avoiding  Alcohol. Follow up in 3 months   2. HTN Noted for elevated BP likely in the  setting of missing her medications. Will refill HCTZ and losartan. Check chem 7 and lipid panel. continue baby aspirin   3. Tobacco abuse  counseled strongly on smoking cessation. Will try the help line   Follow up in 3 months.  MDM          Eddie North, MD 10/10/12 [phone]

## 2012-10-10 NOTE — ED Notes (Signed)
Patient c/o abdominal discomfort x3 weeks. No diarrhea no vomiting  Patient also states needs medication refills

## 2012-10-13 ENCOUNTER — Emergency Department (HOSPITAL_COMMUNITY)
Admission: EM | Admit: 2012-10-13 | Discharge: 2012-10-13 | Discharge status: Absent without leave | Payer: No Typology Code available for payment source | Source: Home / Self Care

## 2012-10-14 ENCOUNTER — Emergency Department (HOSPITAL_COMMUNITY)
Admission: EM | Admit: 2012-10-14 | Discharge: 2012-10-14 | Disposition: A | Discharge status: Home / Self Care | Payer: No Typology Code available for payment source | Source: Home / Self Care

## 2012-10-14 LAB — LIPID PANEL
Cholesterol: 218 mg/dL — ABNORMAL HIGH (ref 0–200)
HDL: 55 mg/dL (ref >39)
LDL Cholesterol: 143 mg/dL — ABNORMAL HIGH (ref 0–99)
Total CHOL/HDL Ratio: 4 RATIO
Triglycerides: 101 mg/dL (ref <150)
VLDL: 20 mg/dL (ref 0–40)

## 2012-10-14 LAB — BASIC METABOLIC PANEL
BUN: 12 mg/dL (ref 6–23)
CO2: 30 mEq/L (ref 19–32)
Calcium: 10 mg/dL (ref 8.4–10.5)
Chloride: 101 mEq/L (ref 96–112)
Creatinine, Ser: 0.83 mg/dL (ref 0.50–1.10)
GFR calc Af Amer: >90 mL/min (ref >90)
GFR calc non Af Amer: 85 mL/min — ABNORMAL LOW (ref >90)
Glucose, Bld: 110 mg/dL — ABNORMAL HIGH (ref 70–99)
Potassium: 3.8 mEq/L (ref 3.5–5.1)
Sodium: 140 mEq/L (ref 135–145)

## 2012-10-14 NOTE — ED Notes (Signed)
Patient here for blood work

## 2012-12-26 ENCOUNTER — Emergency Department (HOSPITAL_COMMUNITY)
Admission: EM | Admit: 2012-12-26 | Discharge: 2012-12-26 | Disposition: A | Discharge status: Home / Self Care | Payer: No Typology Code available for payment source | Source: Home / Self Care

## 2013-02-13 ENCOUNTER — Emergency Department (HOSPITAL_COMMUNITY)
Admission: EM | Admit: 2013-02-13 | Discharge: 2013-02-13 | Disposition: A | Discharge status: Home / Self Care | Payer: Self-pay | Attending: Emergency Medicine | Admitting: Emergency Medicine

## 2013-02-13 ENCOUNTER — Encounter (HOSPITAL_COMMUNITY): Payer: Self-pay | Admitting: Emergency Medicine

## 2013-02-13 ENCOUNTER — Emergency Department (HOSPITAL_COMMUNITY): Payer: Self-pay

## 2013-02-13 DIAGNOSIS — Z7982 Long term (current) use of aspirin: Secondary | ICD-10-CM | POA: Insufficient documentation

## 2013-02-13 DIAGNOSIS — Z79899 Other long term (current) drug therapy: Secondary | ICD-10-CM | POA: Insufficient documentation

## 2013-02-13 DIAGNOSIS — Z8719 Personal history of other diseases of the digestive system: Secondary | ICD-10-CM | POA: Insufficient documentation

## 2013-02-13 DIAGNOSIS — F17200 Nicotine dependence, unspecified, uncomplicated: Secondary | ICD-10-CM | POA: Insufficient documentation

## 2013-02-13 DIAGNOSIS — Z3202 Encounter for pregnancy test, result negative: Secondary | ICD-10-CM | POA: Insufficient documentation

## 2013-02-13 DIAGNOSIS — I10 Essential (primary) hypertension: Secondary | ICD-10-CM | POA: Insufficient documentation

## 2013-02-13 DIAGNOSIS — R0789 Other chest pain: Secondary | ICD-10-CM | POA: Insufficient documentation

## 2013-02-13 DIAGNOSIS — R079 Chest pain, unspecified: Secondary | ICD-10-CM

## 2013-02-13 LAB — COMPREHENSIVE METABOLIC PANEL
ALT: 35 U/L (ref 0–35)
AST: 28 U/L (ref 0–37)
Albumin: 3.9 g/dL (ref 3.5–5.2)
Alkaline Phosphatase: 126 U/L — ABNORMAL HIGH (ref 39–117)
BUN: 12 mg/dL (ref 6–23)
CO2: 27 mEq/L (ref 19–32)
Calcium: 9.4 mg/dL (ref 8.4–10.5)
Chloride: 97 mEq/L (ref 96–112)
Creatinine, Ser: 0.75 mg/dL (ref 0.50–1.10)
GFR calc Af Amer: >90 mL/min (ref >90)
GFR calc non Af Amer: >90 mL/min (ref >90)
Glucose, Bld: 108 mg/dL — ABNORMAL HIGH (ref 70–99)
Potassium: 3.2 mEq/L — ABNORMAL LOW (ref 3.5–5.1)
Sodium: 134 mEq/L — ABNORMAL LOW (ref 135–145)
Total Bilirubin: 0.4 mg/dL (ref 0.3–1.2)
Total Protein: 8.1 g/dL (ref 6.0–8.3)

## 2013-02-13 LAB — CBC WITH DIFFERENTIAL/PLATELET
Basophils Absolute: 0 10*3/uL (ref 0.0–0.1)
Basophils Relative: 1 % (ref 0–1)
Eosinophils Absolute: 0.1 10*3/uL (ref 0.0–0.7)
Eosinophils Relative: 1 % (ref 0–5)
HCT: 43.1 % (ref 36.0–46.0)
Hemoglobin: 14.8 g/dL (ref 12.0–15.0)
Lymphocytes Relative: 38 % (ref 12–46)
Lymphs Abs: 3.3 10*3/uL (ref 0.7–4.0)
MCH: 29.1 pg (ref 26.0–34.0)
MCHC: 34.3 g/dL (ref 30.0–36.0)
MCV: 84.7 fL (ref 78.0–100.0)
Monocytes Absolute: 0.7 10*3/uL (ref 0.1–1.0)
Monocytes Relative: 8 % (ref 3–12)
Neutro Abs: 4.6 10*3/uL (ref 1.7–7.7)
Neutrophils Relative %: 53 % (ref 43–77)
Platelets: 268 10*3/uL (ref 150–400)
RBC: 5.09 MIL/uL (ref 3.87–5.11)
RDW: 13.5 % (ref 11.5–15.5)
WBC: 8.8 10*3/uL (ref 4.0–10.5)

## 2013-02-13 LAB — POCT I-STAT TROPONIN I: Troponin i, poc: 0 ng/mL (ref 0.00–0.08)

## 2013-02-13 LAB — POCT PREGNANCY, URINE: Preg Test, Ur: NEGATIVE

## 2013-02-13 MED ORDER — NAPROXEN 500 MG PO TABS: 500.0000 mg | ORAL_TABLET | Freq: Two times a day (BID) | ORAL | Status: DC

## 2013-02-13 MED ORDER — CYCLOBENZAPRINE HCL 10 MG PO TABS: 10.0000 mg | ORAL_TABLET | Freq: Two times a day (BID) | ORAL | Status: DC | PRN

## 2013-02-13 NOTE — ED Provider Notes (Signed)
History     CSN: [id number]  Arrival date & time 02/13/13  1615   First MD Initiated Contact with Patient 02/13/13 1849      Chief Complaint  Patient presents with  . Chest Pain    (Consider location/radiation/quality/duration/timing/severity/associated sxs/prior treatment) HPI.... intermittent right-sided chest pain for 2 days.  Symptoms are described as sharp lasting approximately 1 minute or less.  No radiation of pain.  No dyspnea, diaphoresis, nausea.   Nothing makes symptoms better or worse.  Severity is mild   Past Medical History  Diagnosis Date  . Iron deficiency   . Hypertension     chronic  . GERD (gastroesophageal reflux disease)     Past Surgical History  Procedure Laterality Date  . Abdominal hysterectomy    . Laparoscopy      Family History  Problem Relation Age of Onset  . Cancer    . Diabetes    . Hypertension      History  Substance Use Topics  . Smoking status: Current Every Day Smoker    Types: Cigarettes  . Smokeless tobacco: Not on file  . Alcohol Use: Yes     Comment: 2 beers per day    OB History   Grav Para Term Preterm Abortions TAB SAB Ect Mult Living                  Review of Systems  All other systems reviewed and are negative.    Allergies  Codeine  Home Medications   Current Outpatient Rx  Name  Route  Sig  Dispense  Refill  . aspirin 81 MG tablet   Oral   Take 1 tablet (81 mg total) by mouth daily.   30 tablet   5   . hydrochlorothiazide (HYDRODIURIL) 25 MG tablet   Oral   Take 1 tablet (25 mg total) by mouth daily.   30 tablet   5   . losartan (COZAAR) 50 MG tablet   Oral   Take 1 tablet (50 mg total) by mouth daily.   30 tablet   5     There were no vitals taken for this visit.  Physical Exam  Nursing note and vitals reviewed. Constitutional: She is oriented to person, place, and time. She appears well-developed and well-nourished.  HENT:  Head: Normocephalic and atraumatic.  Eyes:  Conjunctivae and EOM are normal. Pupils are equal, round, and reactive to light.  Neck: Normal range of motion. Neck supple.  Cardiovascular: Normal rate, regular rhythm and normal heart sounds.   Pulmonary/Chest: Effort normal and breath sounds normal.  Minimal tenderness to palpation right chest wall  Abdominal: Soft. Bowel sounds are normal.  Musculoskeletal: Normal range of motion.  Neurological: She is alert and oriented to person, place, and time.  Skin: Skin is warm and dry.  Psychiatric: She has a normal mood and affect.    ED Course  Procedures (including critical care time)  Labs Reviewed  COMPREHENSIVE METABOLIC PANEL - Abnormal; Notable for the following:    Sodium 134 (*)    Potassium 3.2 (*)    Glucose, Bld 108 (*)    Alkaline Phosphatase 126 (*)    All other components within normal limits  CBC WITH DIFFERENTIAL  POCT I-STAT TROPONIN I  POCT PREGNANCY, URINE   Dg Chest 2 View  02/13/2013  *RADIOLOGY REPORT*  Clinical Data: Chest pain  CHEST - 2 VIEW  Comparison: Chest x-ray of 01/01/2010  Findings: No active infiltrate or effusion  is seen.  Mediastinal contours are stable.  The heart is within upper limits of normal in size.  No bony abnormality is seen.  IMPRESSION: Stable chest x-ray.  No active lung disease.   Original Report Authenticated By: Dwyane Dee, M.D.      No diagnosis found.   Date: 02/13/2013  Rate:80  Rhythm: normal sinus rhythm  QRS Axis: normal  Intervals: normal  ST/T Wave abnormalities: normal  Conduction Disutrbances: none  Narrative Interpretation: unremarkable     MDM  Patient is low risk for acute coronary syndrome and/or pulmonary embolism. Screening test negative. Has primary care follow.  Rx Naprosyn 500 mg #20 and Flexeril 10 mg #20        Donnetta Hutching, MD 02/14/13 1002

## 2013-02-13 NOTE — ED Notes (Signed)
Pt c/o right sided CP into back x 2 days worse with cough

## 2013-09-20 ENCOUNTER — Emergency Department (HOSPITAL_COMMUNITY)
Admission: EM | Admit: 2013-09-20 | Discharge: 2013-09-21 | Disposition: A | Discharge status: Home / Self Care | Payer: Self-pay | Attending: Emergency Medicine | Admitting: Emergency Medicine

## 2013-09-20 ENCOUNTER — Encounter (HOSPITAL_COMMUNITY): Payer: Self-pay | Admitting: Emergency Medicine

## 2013-09-20 DIAGNOSIS — Z7982 Long term (current) use of aspirin: Secondary | ICD-10-CM | POA: Insufficient documentation

## 2013-09-20 DIAGNOSIS — S0990XA Unspecified injury of head, initial encounter: Secondary | ICD-10-CM | POA: Insufficient documentation

## 2013-09-20 DIAGNOSIS — R519 Headache, unspecified: Secondary | ICD-10-CM

## 2013-09-20 DIAGNOSIS — Z8639 Personal history of other endocrine, nutritional and metabolic disease: Secondary | ICD-10-CM | POA: Insufficient documentation

## 2013-09-20 DIAGNOSIS — Z8719 Personal history of other diseases of the digestive system: Secondary | ICD-10-CM | POA: Insufficient documentation

## 2013-09-20 DIAGNOSIS — Y9389 Activity, other specified: Secondary | ICD-10-CM | POA: Insufficient documentation

## 2013-09-20 DIAGNOSIS — F17200 Nicotine dependence, unspecified, uncomplicated: Secondary | ICD-10-CM | POA: Insufficient documentation

## 2013-09-20 DIAGNOSIS — R110 Nausea: Secondary | ICD-10-CM | POA: Insufficient documentation

## 2013-09-20 DIAGNOSIS — Y929 Unspecified place or not applicable: Secondary | ICD-10-CM | POA: Insufficient documentation

## 2013-09-20 DIAGNOSIS — W1809XA Striking against other object with subsequent fall, initial encounter: Secondary | ICD-10-CM | POA: Insufficient documentation

## 2013-09-20 DIAGNOSIS — Z79899 Other long term (current) drug therapy: Secondary | ICD-10-CM | POA: Insufficient documentation

## 2013-09-20 DIAGNOSIS — I10 Essential (primary) hypertension: Secondary | ICD-10-CM | POA: Insufficient documentation

## 2013-09-20 DIAGNOSIS — R55 Syncope and collapse: Secondary | ICD-10-CM | POA: Insufficient documentation

## 2013-09-20 DIAGNOSIS — Z862 Personal history of diseases of the blood and blood-forming organs and certain disorders involving the immune mechanism: Secondary | ICD-10-CM | POA: Insufficient documentation

## 2013-09-20 LAB — BASIC METABOLIC PANEL
BUN: 15 mg/dL (ref 6–23)
CO2: 23 mEq/L (ref 19–32)
Calcium: 9.6 mg/dL (ref 8.4–10.5)
Chloride: 102 mEq/L (ref 96–112)
Creatinine, Ser: 0.79 mg/dL (ref 0.50–1.10)
GFR calc Af Amer: >90 mL/min (ref >90)
GFR calc non Af Amer: >90 mL/min (ref >90)
Glucose, Bld: 124 mg/dL — ABNORMAL HIGH (ref 70–99)
Potassium: 3.7 mEq/L (ref 3.5–5.1)
Sodium: 137 mEq/L (ref 135–145)

## 2013-09-20 LAB — CBC WITH DIFFERENTIAL/PLATELET
Basophils Absolute: 0 10*3/uL (ref 0.0–0.1)
Basophils Relative: 0 % (ref 0–1)
Eosinophils Absolute: 0.1 10*3/uL (ref 0.0–0.7)
Eosinophils Relative: 2 % (ref 0–5)
HCT: 41.3 % (ref 36.0–46.0)
Hemoglobin: 13.9 g/dL (ref 12.0–15.0)
Lymphocytes Relative: 30 % (ref 12–46)
Lymphs Abs: 2 10*3/uL (ref 0.7–4.0)
MCH: 29.6 pg (ref 26.0–34.0)
MCHC: 33.7 g/dL (ref 30.0–36.0)
MCV: 88.1 fL (ref 78.0–100.0)
Monocytes Absolute: 0.7 10*3/uL (ref 0.1–1.0)
Monocytes Relative: 11 % (ref 3–12)
Neutro Abs: 4 10*3/uL (ref 1.7–7.7)
Neutrophils Relative %: 58 % (ref 43–77)
Platelets: 222 10*3/uL (ref 150–400)
RBC: 4.69 MIL/uL (ref 3.87–5.11)
RDW: 15.2 % (ref 11.5–15.5)
WBC: 6.9 10*3/uL (ref 4.0–10.5)

## 2013-09-20 LAB — POCT I-STAT TROPONIN I: Troponin i, poc: 0.03 ng/mL (ref 0.00–0.08)

## 2013-09-20 MED ORDER — FENTANYL CITRATE 0.05 MG/ML IJ SOLN
50.0000 ug | INTRAMUSCULAR | Status: DC | PRN
  Administered 2013-09-21: 50 ug via INTRAVENOUS
  Filled 2013-09-20: qty 2

## 2013-09-20 MED ORDER — NITROGLYCERIN 0.4 MG SL SUBL
0.4000 mg | SUBLINGUAL_TABLET | SUBLINGUAL | Status: DC | PRN
  Administered 2013-09-20: 0.4 mg via SUBLINGUAL

## 2013-09-20 MED ORDER — ONDANSETRON HCL 4 MG/2ML IJ SOLN
4.0000 mg | Freq: Once | INTRAMUSCULAR | Status: AC
  Administered 2013-09-21: 4 mg via INTRAVENOUS
  Filled 2013-09-20: qty 2

## 2013-09-20 NOTE — ED Notes (Signed)
Pt to RN first c/o chest pain. Nitro SL ordered and troponin ordered

## 2013-09-20 NOTE — ED Notes (Signed)
Pt reports that she takes Losartan 50mg  daily and has not taken her medication today.

## 2013-09-20 NOTE — ED Notes (Addendum)
Pt c/o syncope with fall 2 days ago; pt sts some dizziness and headache since falling and hitting head; pt ambulating without difficulty at present

## 2013-09-20 NOTE — ED Provider Notes (Signed)
CSN: [id number]     Arrival date & time 09/20/13  1738 History   First MD Initiated Contact with Patient 09/20/13 2316     Chief Complaint  Patient presents with  . Loss of Consciousness  . Fall  . Headache   (Consider location/radiation/quality/duration/timing/severity/associated sxs/prior Treatment) Patient is a 42 y.o. female presenting with syncope, fall, and headaches.  Loss of Consciousness Associated symptoms: headaches   Associated symptoms: no fever and no shortness of breath   Fall Associated symptoms include headaches. Pertinent negatives include no abdominal pain and no shortness of breath.  Headache Associated symptoms: syncope   Associated symptoms: no abdominal pain, no back pain, no fever, no neck pain and no neck stiffness    Hx per PT - witnessed syncopal event 2 days ago, fell backwards with LOC for about 6 minutes. At the time was moving a dog bed, denies any any sig exertion during this event.  No presyncopal symptoms. She came to with her husband applying to her head.  She did not get evaluated.  She went to work the next day and then developed frontal HA that is not going away. R sided sharp pains mod to severe, took ibuprofen with minimal relief. No weakness or numbness, no trouble speaking or walking. Some nausea, no vomiting. No h/o syncope.   In the waiting room tonight becoming anxious developed brief episode on shooting sharp CP is now resolved at time of my evaluation Past Medical History  Diagnosis Date  . Iron deficiency   . Hypertension     chronic  . GERD (gastroesophageal reflux disease)    Past Surgical History  Procedure Laterality Date  . Abdominal hysterectomy    . Laparoscopy     Family History  Problem Relation Age of Onset  . Cancer    . Diabetes    . Hypertension     History  Substance Use Topics  . Smoking status: Current Every Day Smoker    Types: Cigarettes  . Smokeless tobacco: Not on file  . Alcohol Use: Yes     Comment:  2 beers per day   OB History   Grav Para Term Preterm Abortions TAB SAB Ect Mult Living                 Review of Systems  Constitutional: Negative for fever and chills.  Eyes: Negative for visual disturbance.  Respiratory: Negative for shortness of breath.   Cardiovascular: Positive for syncope.  Gastrointestinal: Negative for abdominal pain.  Genitourinary: Negative for dysuria.  Musculoskeletal: Negative for back pain, neck pain and neck stiffness.  Skin: Negative for rash.  Neurological: Positive for syncope and headaches.  All other systems reviewed and are negative.    Allergies  Codeine  Home Medications   Current Outpatient Rx  Name  Route  Sig  Dispense  Refill  . aspirin 81 MG chewable tablet   Oral   Chew by mouth daily.         . hydrochlorothiazide (HYDRODIURIL) 25 MG tablet   Oral   Take 1 tablet (25 mg total) by mouth daily.   30 tablet   5   . losartan (COZAAR) 50 MG tablet   Oral   Take 1 tablet (50 mg total) by mouth daily.   30 tablet   5    BP 157/73  Pulse 61  Temp(Src) 98.6 F (37 C) (Oral)  Resp 20  Wt 140 lb 1.6 oz (63.549 kg)  SpO2 100% Physical  Exam  Constitutional: She is oriented to person, place, and time. She appears well-developed and well-nourished.  HENT:  Head: Normocephalic.  Mild tenderness R scalp no hematoma, TMs clear  Eyes: EOM are normal. Pupils are equal, round, and reactive to light.  Neck: Neck supple.  No C spine tenderness or deformity  Cardiovascular: Normal rate, regular rhythm and intact distal pulses.   Pulmonary/Chest: Effort normal and breath sounds normal. No respiratory distress.  Abdominal: Soft. Bowel sounds are normal. She exhibits no distension. There is no tenderness.  Musculoskeletal: Normal range of motion. She exhibits no edema and no tenderness.  Neurological: She is alert and oriented to person, place, and time. She displays normal reflexes. No cranial nerve deficit. She exhibits normal  muscle tone. Coordination normal.  Skin: Skin is warm and dry.    ED Course  Procedures (including critical care time) Labs Review Labs Reviewed  BASIC METABOLIC PANEL - Abnormal; Notable for the following:    Glucose, Bld 124 (*)    All other components within normal limits  CBC WITH DIFFERENTIAL  POCT I-STAT TROPONIN I   Imaging Review Ct Head Wo Contrast  09/21/2013   CLINICAL DATA:  Status post syncope and fall; dizziness and headache. Concern for cervical spine injury.  EXAM: CT HEAD WITHOUT CONTRAST  CT CERVICAL SPINE WITHOUT CONTRAST  TECHNIQUE: Multidetector CT imaging of the head and cervical spine was performed following the standard protocol without intravenous contrast. Multiplanar CT image reconstructions of the cervical spine were also generated.  COMPARISON:  CT of the head performed 11/24/2009, and cervical spine radiographs performed 03/04/2011  FINDINGS: CT HEAD FINDINGS  There is no evidence of acute infarction, mass lesion, or intra- or extra-axial hemorrhage on CT.  The posterior fossa, including the cerebellum, brainstem and fourth ventricle, is within normal limits. The third and lateral ventricles, and basal ganglia are unremarkable in appearance. The cerebral hemispheres are symmetric in appearance, with normal gray-white differentiation. No mass effect or midline shift is seen.  There is no evidence of fracture; visualized osseous structures are unremarkable in appearance. The orbits are within normal limits. The paranasal sinuses and mastoid air cells are well-aerated. No significant soft tissue abnormalities are seen.  CT CERVICAL SPINE FINDINGS  There is no evidence of fracture or subluxation. Vertebral bodies demonstrate normal height and alignment. Intervertebral disc spaces are preserved. Prevertebral soft tissues are within normal limits. The visualized neural foramina are grossly unremarkable.  The thyroid gland is unremarkable in appearance. The visualized lung  apices are clear. No significant soft tissue abnormalities are seen.  IMPRESSION: 1. No evidence traumatic intracranial injury or fracture. 2. No evidence of fracture or subluxation along the cervical spine.   Electronically Signed   By: Roanna Raider M.D.   On: 09/21/2013 01:09   Ct Cervical Spine Wo Contrast  09/21/2013   CLINICAL DATA:  Status post syncope and fall; dizziness and headache. Concern for cervical spine injury.  EXAM: CT HEAD WITHOUT CONTRAST  CT CERVICAL SPINE WITHOUT CONTRAST  TECHNIQUE: Multidetector CT imaging of the head and cervical spine was performed following the standard protocol without intravenous contrast. Multiplanar CT image reconstructions of the cervical spine were also generated.  COMPARISON:  CT of the head performed 11/24/2009, and cervical spine radiographs performed 03/04/2011  FINDINGS: CT HEAD FINDINGS  There is no evidence of acute infarction, mass lesion, or intra- or extra-axial hemorrhage on CT.  The posterior fossa, including the cerebellum, brainstem and fourth ventricle, is within normal limits.  The third and lateral ventricles, and basal ganglia are unremarkable in appearance. The cerebral hemispheres are symmetric in appearance, with normal gray-white differentiation. No mass effect or midline shift is seen.  There is no evidence of fracture; visualized osseous structures are unremarkable in appearance. The orbits are within normal limits. The paranasal sinuses and mastoid air cells are well-aerated. No significant soft tissue abnormalities are seen.  CT CERVICAL SPINE FINDINGS  There is no evidence of fracture or subluxation. Vertebral bodies demonstrate normal height and alignment. Intervertebral disc spaces are preserved. Prevertebral soft tissues are within normal limits. The visualized neural foramina are grossly unremarkable.  The thyroid gland is unremarkable in appearance. The visualized lung apices are clear. No significant soft tissue abnormalities are  seen.  IMPRESSION: 1. No evidence traumatic intracranial injury or fracture. 2. No evidence of fracture or subluxation along the cervical spine.   Electronically Signed   By: Roanna Raider M.D.   On: 09/21/2013 01:09    EKG Interpretation     Ventricular Rate:  68 PR Interval:  188 QRS Duration: 86 QT Interval:  422 QTC Calculation: 448 R Axis:   59 Text Interpretation:  Normal sinus rhythm Nonspecific T wave abnormality Abnormal ECG No significant change since last tracing             IV fentanyl provided. Headache improved and patient ambulating in the ED no acute distress. No neuro deficits.  Given headache onset today after syncope, doubt subarachnoid hemorrhage. Imaging reviewed as above. Labs reviewed. Plan discharge home with outpatient followup. Reliable historian. Return precautions verbalizes understood. Prescription provided  MDM  Diagnoses: Headache, recent syncope  EKG, labs, CT head and neck Improved with IV narcotics Serial evaluations no neuro deficits Vital signs nurse's notes reviewed    Sunnie Nielsen, MD 09/21/13 0301

## 2013-09-21 ENCOUNTER — Emergency Department (HOSPITAL_COMMUNITY): Payer: Self-pay

## 2013-09-21 MED ORDER — TRAMADOL HCL 50 MG PO TABS: 50.0000 mg | ORAL_TABLET | Freq: Four times a day (QID) | ORAL | Status: DC | PRN

## 2013-09-21 NOTE — ED Notes (Signed)
Pt's monitor was alarming that the pt was in asystole. This RN checked the pt's radial pulse manually, and found that the pt's heart rate was 58, not 0.

## 2013-12-13 ENCOUNTER — Encounter: Payer: Self-pay | Admitting: Cardiology

## 2014-03-09 ENCOUNTER — Other Ambulatory Visit: Payer: Self-pay

## 2014-03-09 ENCOUNTER — Encounter (HOSPITAL_COMMUNITY): Payer: Self-pay | Admitting: Emergency Medicine

## 2014-03-09 ENCOUNTER — Emergency Department (HOSPITAL_COMMUNITY)
Admission: EM | Admit: 2014-03-09 | Discharge: 2014-03-09 | Disposition: A | Discharge status: Home / Self Care | Payer: No Typology Code available for payment source | Attending: Emergency Medicine | Admitting: Emergency Medicine

## 2014-03-09 DIAGNOSIS — R42 Dizziness and giddiness: Secondary | ICD-10-CM | POA: Insufficient documentation

## 2014-03-09 DIAGNOSIS — Z8719 Personal history of other diseases of the digestive system: Secondary | ICD-10-CM | POA: Insufficient documentation

## 2014-03-09 DIAGNOSIS — Z862 Personal history of diseases of the blood and blood-forming organs and certain disorders involving the immune mechanism: Secondary | ICD-10-CM | POA: Insufficient documentation

## 2014-03-09 DIAGNOSIS — F17200 Nicotine dependence, unspecified, uncomplicated: Secondary | ICD-10-CM | POA: Insufficient documentation

## 2014-03-09 DIAGNOSIS — F39 Unspecified mood [affective] disorder: Secondary | ICD-10-CM | POA: Insufficient documentation

## 2014-03-09 DIAGNOSIS — I10 Essential (primary) hypertension: Secondary | ICD-10-CM | POA: Insufficient documentation

## 2014-03-09 DIAGNOSIS — Z7982 Long term (current) use of aspirin: Secondary | ICD-10-CM | POA: Insufficient documentation

## 2014-03-09 DIAGNOSIS — R5383 Other fatigue: Secondary | ICD-10-CM

## 2014-03-09 DIAGNOSIS — Z79899 Other long term (current) drug therapy: Secondary | ICD-10-CM | POA: Insufficient documentation

## 2014-03-09 DIAGNOSIS — R5381 Other malaise: Secondary | ICD-10-CM | POA: Insufficient documentation

## 2014-03-09 DIAGNOSIS — R51 Headache: Secondary | ICD-10-CM | POA: Insufficient documentation

## 2014-03-09 LAB — CBC
HCT: 46.4 % — ABNORMAL HIGH (ref 36.0–46.0)
Hemoglobin: 15.9 g/dL — ABNORMAL HIGH (ref 12.0–15.0)
MCH: 31.1 pg (ref 26.0–34.0)
MCHC: 34.3 g/dL (ref 30.0–36.0)
MCV: 90.6 fL (ref 78.0–100.0)
Platelets: 169 10*3/uL (ref 150–400)
RBC: 5.12 MIL/uL — ABNORMAL HIGH (ref 3.87–5.11)
RDW: 14 % (ref 11.5–15.5)
WBC: 9.9 10*3/uL (ref 4.0–10.5)

## 2014-03-09 LAB — BASIC METABOLIC PANEL
BUN: 15 mg/dL (ref 6–23)
CO2: 20 mEq/L (ref 19–32)
Calcium: 10.2 mg/dL (ref 8.4–10.5)
Chloride: 99 mEq/L (ref 96–112)
Creatinine, Ser: 0.59 mg/dL (ref 0.50–1.10)
GFR calc Af Amer: >90 mL/min (ref >90)
GFR calc non Af Amer: >90 mL/min (ref >90)
Glucose, Bld: 121 mg/dL — ABNORMAL HIGH (ref 70–99)
Potassium: 4 mEq/L (ref 3.7–5.3)
Sodium: 138 mEq/L (ref 137–147)

## 2014-03-09 MED ORDER — SODIUM CHLORIDE 0.9 % IV BOLUS (SEPSIS)
1000.0000 mL | Freq: Once | INTRAVENOUS | Status: AC
  Administered 2014-03-09: 1000 mL via INTRAVENOUS

## 2014-03-09 NOTE — ED Provider Notes (Signed)
Medical screening examination/treatment/procedure(s) were performed by non-physician practitioner and as supervising physician I was immediately available for consultation/collaboration.    Dot Lanes, MD 03/09/14 2204

## 2014-03-09 NOTE — ED Notes (Signed)
Peter PA at bedside

## 2014-03-09 NOTE — ED Provider Notes (Signed)
CSN: [id number]     Arrival date & time 03/09/14  1826 History   First MD Initiated Contact with Patient 03/09/14 2004     Chief Complaint  Patient presents with  . Dizziness   HPI  History provided by the patient. Patient is a 43 year old female with history of hypertension who presents with complaints of episodes of lightheadedness and weakness.  Pt reports symptoms have been off and on for past 1-2 weeks.  She has especially felt more lightheaded for the past 3 days.  Symptoms are waxing and waning.  They seem worse with standing and moving around.  Does not describe symptoms as the room of her body spinning. She does feel slightly off balance at times. She also reports associated weakness through her body and extremities.  Denies any syncopal episodes.  She does report having increased sadness after her husband passed away last 2022-11-17.  She has had weight loss since that time.  Her sleep has also been affected on some nights.  She states she has been trying to eat and drink normally. She also reports having some occasional cramping in her muscles. Patient does work as a Retail buyer and does find some increased dizziness and lightheadedness when she is vacuuming. There has been some associated headache and pressure behind the eyes at times as well. Denies any recent illness. No cough, congestion or fever. No shortness of breath. No fever, chills or sweats.   Past Medical History  Diagnosis Date  . Iron deficiency   . Hypertension     chronic  . GERD (gastroesophageal reflux disease)    Past Surgical History  Procedure Laterality Date  . Abdominal hysterectomy    . Laparoscopy     Family History  Problem Relation Age of Onset  . Cancer    . Diabetes    . Hypertension     History  Substance Use Topics  . Smoking status: Current Every Day Smoker -- 0.50 packs/day    Types: Cigarettes  . Smokeless tobacco: Not on file  . Alcohol Use: Yes     Comment: 2 beers per day   OB History   Grav Para Term Preterm Abortions TAB SAB Ect Mult Living                 Review of Systems  Constitutional: Negative for fever, chills and diaphoresis.  Eyes: Negative for photophobia and visual disturbance.  Respiratory: Negative for cough and shortness of breath.   Cardiovascular: Negative for palpitations and leg swelling.  Gastrointestinal: Negative for nausea, vomiting, abdominal pain and diarrhea.  Neurological: Positive for dizziness, light-headedness and headaches. Negative for weakness and numbness.  Psychiatric/Behavioral: Positive for dysphoric mood.  All other systems reviewed and are negative.     Allergies  Codeine  Home Medications   Prior to Admission medications   Medication Sig Start Date End Date Taking? Authorizing Provider  aspirin 81 MG chewable tablet Chew 81 mg by mouth daily.    Yes Historical Provider, MD  hydrochlorothiazide (HYDRODIURIL) 25 MG tablet Take 1 tablet (25 mg total) by mouth daily. 10/10/12  Yes Nishant Dhungel, MD  losartan (COZAAR) 50 MG tablet Take 1 tablet (50 mg total) by mouth daily. 10/10/12  Yes Nishant Dhungel, MD  Multiple Vitamin (MULTIVITAMIN WITH MINERALS) TABS tablet Take 1 tablet by mouth daily.   Yes Historical Provider, MD  naproxen sodium (ANAPROX) 220 MG tablet Take 440 mg by mouth daily as needed.   Yes Historical Provider, MD  traMADol (  ULTRAM) 50 MG tablet Take 50 mg by mouth every 6 (six) hours as needed for moderate pain.   Yes Historical Provider, MD   BP 148/89  Pulse 98  Temp(Src) 98.3 F (36.8 C) (Oral)  Resp 20  Ht 5\' 6"  (1.676 m)  Wt 130 lb (58.968 kg)  BMI 20.99 kg/m2  SpO2 99% Physical Exam  Nursing note and vitals reviewed. Constitutional: She is oriented to person, place, and time. She appears well-developed and well-nourished. No distress.  HENT:  Head: Normocephalic and atraumatic.  Eyes: Conjunctivae and EOM are normal. Pupils are equal, round, and reactive to light.  Cardiovascular: Normal rate  and regular rhythm.   No murmur heard. Pulmonary/Chest: Effort normal and breath sounds normal. No respiratory distress. She has no wheezes. She has no rales.  Abdominal: Soft.  Musculoskeletal: Normal range of motion. She exhibits no edema and no tenderness.  Neurological: She is alert and oriented to person, place, and time. She has normal strength. No cranial nerve deficit or sensory deficit. She displays a negative Romberg sign. Coordination normal.  Skin: Skin is warm and dry. No rash noted.  Psychiatric: She has a normal mood and affect. Her behavior is normal.    ED Course  Procedures   COORDINATION OF CARE:  Nursing notes reviewed. Vital signs reviewed. Initial pt interview and examination performed.   Filed Vitals:   03/09/14 1834  BP: 148/89  Pulse: 98  Temp: 98.3 F (36.8 C)  TempSrc: Oral  Resp: 20  Height: 5\' 6"  (1.676 m)  Weight: 130 lb (58.968 kg)  SpO2: 99%    9:20PM patient seen and evaluated. Patient appears well no acute distress. She does become slightly tearful when talking about her husband's recent death. She has normal nonfocal neuro exam. Patient was evaluated for a syncopal episode last November, 7 months ago, with CT scans showing normal head and brain. Patient has unremarkable laboratory testing. Does have increased heart rate during orthostatic vital signs. No hypotension. Unchanged ECG. At this time we'll give some IV fluids. Patient agrees with plan.  No other signs or concerns for an emergent condition. I did discuss with patient the need to follow up with her primary care provider. Given her recent husband's death and signs of depression I feel this may be contributing to some of her symptoms. There may be some nutritional issues. We discussed good eating habits. We'll plan to give resource guide and PCP referral. Patient agrees.   Results for orders placed during the hospital encounter of 03/09/14  CBC      Result Value Ref Range   WBC 9.9  4.0  - 10.5 K/uL   RBC 5.12 (*) 3.87 - 5.11 MIL/uL   Hemoglobin 15.9 (*) 12.0 - 15.0 g/dL   HCT 46.4 (*) 36.0 - 46.0 %   MCV 90.6  78.0 - 100.0 fL   MCH 31.1  26.0 - 34.0 pg   MCHC 34.3  30.0 - 36.0 g/dL   RDW 14.0  11.5 - 15.5 %   Platelets 169  150 - 400 K/uL  BASIC METABOLIC PANEL      Result Value Ref Range   Sodium 138  137 - 147 mEq/L   Potassium 4.0  3.7 - 5.3 mEq/L   Chloride 99  96 - 112 mEq/L   CO2 20  19 - 32 mEq/L   Glucose, Bld 121 (*) 70 - 99 mg/dL   BUN 15  6 - 23 mg/dL   Creatinine, Ser 0.59  0.50 - 1.10 mg/dL   Calcium 10.2  8.4 - 10.5 mg/dL   GFR calc non Af Amer >90  >90 mL/min   GFR calc Af Amer >90  >90 mL/min       EKG Interpretation None      Date: 03/09/2014  Rate: 96  Rhythm: normal sinus rhythm  QRS Axis: normal  Intervals: normal  ST/T Wave abnormalities: normal  Conduction Disutrbances:none  Narrative Interpretation: LVH possible biatrial enlargement  Old EKG Reviewed: unchanged    MDM   Final diagnoses:  Lightheadedness        Martie Lee, PA-C 03/09/14 2203

## 2014-03-09 NOTE — ED Notes (Addendum)
PT c/o a dizzy episode 2 weeks ago.  She began experiencing them again on Wed.  Denies precipitating factors.  States dizzy spells are accompanied by complete exhaustion and bil leg cramps.  Pt also c/o pressure behind both eyes.

## 2014-03-09 NOTE — Discharge Instructions (Signed)
Your laboratory testing today did not show any signs for an emergent condition. Please followup with the primary care provider for continued evaluation and treatment. Eat a well balanced healthy diet and drink plenty of fluids. Get plenty of rest. Return for any changing or worsening symptoms.   Near-Syncope Near-syncope (commonly known as near fainting) is sudden weakness, dizziness, or feeling like you might pass out. This can happen when getting up or while standing for a long time. It is caused by a sudden decrease in blood flow to the brain, which can occur for various reasons. Most of the reasons are not serious.  HOME CARE Watch your condition for any changes.  Have someone stay with you until you feel stable.  If you feel like you are going to pass out:  Lie down right away.  Breathe deeply and steadily.  Move only when the feeling has gone away. Most of the time, this feeling lasts only a few minutes. You may feel tired for several hours.  Drink enough fluids to keep your pee (urine) clear or pale yellow.  If you are taking blood pressure or heart medicine, stand up slowly.  Follow up with your doctor as told. GET HELP RIGHT AWAY IF:   You have a severe headache.  You have unusual pain in the chest, belly (abdomen), or back.  You have bleeding from the mouth or butt (rectum), or you have black or tarry poop (stool).  You feel your heart beat differently than normal, or you have a very fast pulse.  You pass out, or you twitch and shake when you pass out.  You pass out when sitting or lying down.  You feel confused.  You have trouble walking.  You are weak.  You have vision problems. MAKE SURE YOU:   Understand these instructions.  Will watch your condition.  Will get help right away if you are not doing well or get worse. Document Released: 04/13/2008 Document Revised: 06/28/2013 Document Reviewed: 03/31/2013 [facility] Patient Information 2014 [place].    Emergency Department Resource Guide 1) Find a Doctor and Pay Out of Pocket Although you won't have to find out who is covered by your insurance plan, it is a good idea to ask around and get recommendations. You will then need to call the office and see if the doctor you have chosen will accept you as a new patient and what types of options they offer for patients who are self-pay. Some doctors offer discounts or will set up payment plans for their patients who do not have insurance, but you will need to ask so you aren't surprised when you get to your appointment.  2) Contact Your Local Health Department Not all health departments have doctors that can see patients for sick visits, but many do, so it is worth a call to see if yours does. If you don't know where your local health department is, you can check in your phone book. The CDC also has a tool to help you locate your state's health department, and many state websites also have listings of all of their local health departments.  3) Find a [facility] [facility] If your illness is not likely to be very severe or complicated, you may want to try a walk in clinic. These are popping up all over the country in pharmacies, drugstores, and shopping centers. They're usually staffed by nurse practitioners or physician assistants that have been trained to treat common illnesses and complaints. They're usually  fairly quick and inexpensive. However, if you have serious medical issues or chronic medical problems, these are probably not your best option.  No Primary Care Doctor: - Call Health Connect at  [phone] - they can help you locate a primary care doctor that  accepts your insurance, provides certain services, etc. - Physician Referral Service- [phone]  Chronic Pain Problems: Organization         Address  Phone   Notes  [facility]  [phone] Patients need to be referred by their primary care doctor.   Medication  Assistance: Organization         Address  Phone   Notes  [facility] Medication [facility] [address] [place], [place] [address] [phone] --Must be a resident of [facility] -- Must have NO insurance coverage whatsoever (no Medicaid/ Medicare, etc.) -- The pt. MUST have a primary care doctor that directs their care regularly and follows them in the community   Med[facility]ist  [phone]   [employer]  [phone]    Agencies that provide inexpensive medical care: Organization         Address  Phone   Notes  [place]  [phone]   [facility]    [phone]   [facility] [place], [place] [address] [phone]   [place] [address] [place] [phone]   Planned Parenthood    [phone]   [facility]    [phone]   [place] and [place] [address], [place] Phone:  [phone], Fax:  [phone] Hours of Operation:  9 am - 6 pm, M-F.  Also accepts Medicaid/Medicare and self-pay.  [facility] for [place] [address], [address], [place] Phone: [phone], Fax: [phone]. Hours of Operation:  8:30 am - 5:30 pm, M-F.  Also accepts Medicaid and self-pay.  [facility] [place] [address] [place] Phone: [phone]   [place], [place], [place] [phone], Ext. [phone] Mondays & Thursdays: 7-9 AM.  First 15 patients are seen on a first come, first serve basis.    [place] Providers:  Organization         Address  Phone   Notes  [facility] [address],  [phone] Also accepts self-pay patients.  [facility] [address], [place]  [phone]   [place], [address], [place]  [phone]   [facility] [address], [place] [phone]   Lucianne Lei [address], [place]   [phone] Only accepts [place] Access [place] patients after they have their name applied to their card.   Self-Pay (no insurance) in [facility]:  Organization         Address  Phone   Notes  Sickle Cell Patients, [facility] Internal Medicine [place] [phone]   [facility] Urgent Care [place] [phone]   [facility] [place]  [place], [address], [place] [phone]   [facility]/Dr. Osei-Bonsu  [address] [place] or [place], [address], [place] [phone] Phone  number for both [place] and [place] locations is the same.  [facility] [address] [place] [phone]   [facility] [address] [place] [address] [place] or [address] [phone]   [facility] [address] [place] [phone], phone; [phone], fax Sees patients 1st and 3rd Saturday of every month.  Must not qualify for public or private insurance (i.e. Medicaid, Medicare, [place] Health Choice, Veterans' Benefits)  Household income should be no more than 200% of the poverty level The clinic cannot treat you if you are pregnant or think you are pregnant  Sexually transmitted diseases are not treated at the clinic.    Dental Care: Organization         Address  Phone  Notes  [facility] Department of [facility] [phone] Accepts children up to age 55 who are enrolled in [place] or [place]; pregnant women with a Medicaid card; and children who have applied for Medicaid or [place] Health Choice, but were declined, whose parents can pay a reduced fee at time of service.  [facility]  Department of [facility]  [address], [place] [phone] Accepts children up to age 47 who are enrolled in [place] or [place]; pregnant women with a Medicaid card; and children who have applied for Medicaid or [place] Health Choice, but were declined, whose parents can pay a reduced fee at time of service.  [place] [facility] PRO[facility]  [place] [phone] Patients are seen by appointment only. Walk-ins are not accepted. [place] will see patients 50 years of age and older. Monday - Tuesday (8am-5pm) Most Wednesdays (8:30-5pm) $30 per visit, cash only  [facility] Adult Dental Access PROGRAM  [address], [facility] [phone] Patients are seen by appointment only. Walk-ins are not accepted. [place] will see patients 39 years of age and older. One Wednesday Evening (Monthly: Volunteer Based).  $30 per visit, cash only  [place]  [phone] for adults; Children under age 51, call Graduate Pediatric Dentistry at [phone]. Children aged 14-14, please call [phone] to request a pediatric application.  Dental services are provided in all areas of dental care including fillings, crowns and bridges, complete and partial dentures, implants, gum treatment, root canals, and extractions. Preventive care is also provided. Treatment is provided to both adults and children. Patients are selected via a lottery and there is often a waiting list.   [facility] [address] [place]  [phone] [email or web address]   [facility] [address] [place], [place] [phone], Ext. [phone] Second and Fourth Thursday of each month, opens at 6:30 AM; Clinic ends at 9 AM.  Patients are seen on a first-come first-served basis, and a limited number are seen during each clinic.   [facility]  [address] Hillard Danker [place], [place] [phone]    Eligibility Requirements You must have lived in [place], [place], or [place] counties for at least the last three months.   You cannot be eligible for state or federal sponsored [employer], including [employer], [place], or [employer].   You generally cannot be eligible for healthcare insurance through your employer.    How to apply: Eligibility screenings are held every Tuesday and Wednesday afternoon from 1:00 pm  until 4:00 pm. You do not need an appointment for the interview!  [facility] [address] [place], [place] [phone]   [facility] Health Department  [phone]   [facility] Health Department  [phone]   [facility] Health Department  [phone]    Behavioral Health Resources in the Community: Intensive Outpatient Programs Organization         Address  Phone  Notes  [facility] Services [address], [place], [place] [phone]   [facility] Outpatient [address] [place], [place] [phone]   [facility] [address], [place], [place]  [phone]   [facility] Mental Health [address],  [place], [place] [phone] or [phone]   Substance Abuse Resources Organization         Address  Phone  Notes  [facility] Drug Services  [phone]   [facility]  [phone]   [facility] [place]  [phone]   Floydene Flock  [phone]   Residential & Outpatient Substance Abuse Program  [phone]   Psychological Services Organization         Address  Phone  Notes  [facility] Behavioral Health  [phone]   [facility] Services  [phone]   [facility] Mental Health [address], [place] [phone] or [phone]    Mobile Crisis Teams Organization         Address  Phone  Notes  [facility], Mobile Crisis Care Unit  [phone]   [facility]  [address]  [place], [place] [phone]   Doristine Locks [address] [place] [phone]    Self-Help/Support Groups Organization         Address  Phone             Notes  [facility] [place] - variety of support groups  [phone]- I7437963 Call for more information  Narcotics Anonymous (NA), [facility] [address], [employer] [place]  2 meetings at this location   Statistician         Address  Phone  Notes  [facility] [address],    [place]  [phone]   [facility]  [address] [place] [address], [place], [place] [phone]   [facility] [address] [place], [place] [phone] Admissions: 8am-3pm M-F  [facility] [address]    [place], [place] [phone]   [facility] [address] [place], [place], [place] [phone]   [facility] [address]  [place], [place] [phone]   [facility] - Intensive Outpatient [address], [place], [place] [phone]   [facility] (Addiction [facility] [address] [place].,  [place], [place] [phone] or [phone]   [facility] ([facility]) [address], [place], [place] [phone] Accepts Medicaid  [facility] [place] [address]  [place] [phone] Substance Abuse/Addiction Treatment   [facility] Organization         Address  Phone  Notes  [facility]  [phone]   Angie Fava, PhD [address] Ervin Knack [place], [place]   [phone] or [phone]   [facility] Behavioral   [address] [place], [place] [phone]   [facility] [address], [place], [place] [phone] Insurance/Medicaid/sponsorship through [employer] and Families [address], [place] [phone] [place], [place] [phone]    Dr. Adele Schilder  [phone]   [facility] Dept. 1) [address], [place] 2) [place] 3)  [place] [address], [place] [phone]  [phone]   [place] [phone] or [phone] (After Hours)

## 2015-05-06 ENCOUNTER — Emergency Department (HOSPITAL_COMMUNITY)
Admission: EM | Admit: 2015-05-06 | Discharge: 2015-05-06 | Disposition: A | Discharge status: Home / Self Care | Payer: No Typology Code available for payment source | Attending: Emergency Medicine | Admitting: Emergency Medicine

## 2015-05-06 ENCOUNTER — Emergency Department (HOSPITAL_COMMUNITY): Payer: No Typology Code available for payment source

## 2015-05-06 ENCOUNTER — Encounter (HOSPITAL_COMMUNITY): Payer: Self-pay

## 2015-05-06 DIAGNOSIS — Z862 Personal history of diseases of the blood and blood-forming organs and certain disorders involving the immune mechanism: Secondary | ICD-10-CM | POA: Insufficient documentation

## 2015-05-06 DIAGNOSIS — Z90710 Acquired absence of both cervix and uterus: Secondary | ICD-10-CM | POA: Insufficient documentation

## 2015-05-06 DIAGNOSIS — F32A Depression, unspecified: Secondary | ICD-10-CM

## 2015-05-06 DIAGNOSIS — I10 Essential (primary) hypertension: Secondary | ICD-10-CM | POA: Insufficient documentation

## 2015-05-06 DIAGNOSIS — Z720 Tobacco use: Secondary | ICD-10-CM | POA: Insufficient documentation

## 2015-05-06 DIAGNOSIS — R1013 Epigastric pain: Secondary | ICD-10-CM | POA: Insufficient documentation

## 2015-05-06 DIAGNOSIS — Z8719 Personal history of other diseases of the digestive system: Secondary | ICD-10-CM | POA: Insufficient documentation

## 2015-05-06 DIAGNOSIS — M791 Myalgia, unspecified site: Secondary | ICD-10-CM

## 2015-05-06 DIAGNOSIS — F329 Major depressive disorder, single episode, unspecified: Secondary | ICD-10-CM | POA: Insufficient documentation

## 2015-05-06 DIAGNOSIS — N898 Other specified noninflammatory disorders of vagina: Secondary | ICD-10-CM | POA: Insufficient documentation

## 2015-05-06 LAB — URINE MICROSCOPIC-ADD ON

## 2015-05-06 LAB — COMPREHENSIVE METABOLIC PANEL
ALT: 46 U/L (ref 14–54)
AST: 49 U/L — ABNORMAL HIGH (ref 15–41)
Albumin: 3.5 g/dL (ref 3.5–5.0)
Alkaline Phosphatase: 96 U/L (ref 38–126)
Anion gap: 7 (ref 5–15)
BUN: 7 mg/dL (ref 6–20)
CO2: 23 mmol/L (ref 22–32)
Calcium: 9.4 mg/dL (ref 8.9–10.3)
Chloride: 105 mmol/L (ref 101–111)
Creatinine, Ser: 0.65 mg/dL (ref 0.44–1.00)
GFR calc Af Amer: >60 mL/min (ref >60)
GFR calc non Af Amer: >60 mL/min (ref >60)
Glucose, Bld: 94 mg/dL (ref 65–99)
Potassium: 3.8 mmol/L (ref 3.5–5.1)
Sodium: 135 mmol/L (ref 135–145)
Total Bilirubin: 0.5 mg/dL (ref 0.3–1.2)
Total Protein: 7 g/dL (ref 6.5–8.1)

## 2015-05-06 LAB — URINALYSIS, ROUTINE W REFLEX MICROSCOPIC
Bilirubin Urine: NEGATIVE
Glucose, UA: NEGATIVE mg/dL
Hgb urine dipstick: NEGATIVE
Ketones, ur: NEGATIVE mg/dL
Nitrite: NEGATIVE
Protein, ur: NEGATIVE mg/dL
Specific Gravity, Urine: 1.019 (ref 1.005–1.030)
Urobilinogen, UA: 0.2 mg/dL (ref 0.0–1.0)
pH: 5.5 (ref 5.0–8.0)

## 2015-05-06 LAB — CBC
HCT: 41 % (ref 36.0–46.0)
Hemoglobin: 13.7 g/dL (ref 12.0–15.0)
MCH: 30.1 pg (ref 26.0–34.0)
MCHC: 33.4 g/dL (ref 30.0–36.0)
MCV: 90.1 fL (ref 78.0–100.0)
Platelets: 153 10*3/uL (ref 150–400)
RBC: 4.55 MIL/uL (ref 3.87–5.11)
RDW: 15 % (ref 11.5–15.5)
WBC: 8.8 10*3/uL (ref 4.0–10.5)

## 2015-05-06 LAB — LIPASE, BLOOD: Lipase: 20 U/L — ABNORMAL LOW (ref 22–51)

## 2015-05-06 LAB — GC/CHLAMYDIA PROBE AMP (~~LOC~~) NOT AT ARMC
Chlamydia: NEGATIVE
Neisseria Gonorrhea: NEGATIVE

## 2015-05-06 LAB — WET PREP, GENITAL
Clue Cells Wet Prep HPF POC: NONE SEEN
Yeast Wet Prep HPF POC: NONE SEEN

## 2015-05-06 LAB — HIV ANTIBODY (ROUTINE TESTING W REFLEX): HIV Screen 4th Generation wRfx: NONREACTIVE

## 2015-05-06 MED ORDER — ONDANSETRON HCL 4 MG/2ML IJ SOLN
4.0000 mg | Freq: Once | INTRAMUSCULAR | Status: AC
  Administered 2015-05-06: 4 mg via INTRAVENOUS
  Filled 2015-05-06: qty 2

## 2015-05-06 MED ORDER — MORPHINE SULFATE 4 MG/ML IJ SOLN
4.0000 mg | Freq: Once | INTRAMUSCULAR | Status: AC
  Administered 2015-05-06: 4 mg via INTRAVENOUS
  Filled 2015-05-06: qty 1

## 2015-05-06 MED ORDER — HYDROCHLOROTHIAZIDE 25 MG PO TABS: 25.0000 mg | ORAL_TABLET | Freq: Every day | ORAL | Status: DC

## 2015-05-06 MED ORDER — LOSARTAN POTASSIUM 50 MG PO TABS: 50.0000 mg | ORAL_TABLET | Freq: Every day | ORAL | Status: DC

## 2015-05-06 MED ORDER — METRONIDAZOLE 500 MG PO TABS
2000.0000 mg | ORAL_TABLET | Freq: Once | ORAL | Status: AC
  Administered 2015-05-06: 2000 mg via ORAL
  Filled 2015-05-06: qty 4

## 2015-05-06 MED ORDER — SODIUM CHLORIDE 0.9 % IV BOLUS (SEPSIS)
1000.0000 mL | Freq: Once | INTRAVENOUS | Status: AC
  Administered 2015-05-06: 1000 mL via INTRAVENOUS

## 2015-05-06 NOTE — ED Notes (Signed)
Pt states she started having leg cramping and feet burning a few weeks ago; pt states she has been constantly loosing weight over the past month; pt states she starting feeling dizzy earlier this week and c/o generalized body pain. Pt c/o pain 5/10

## 2015-05-06 NOTE — Discharge Instructions (Signed)
Read the information below.  You may return to the Emergency Department at any time for worsening condition or any new symptoms that concern you.  If you develop fevers, uncontrolled pain, uncontrolled vomiting, or difficulty breathing or swallowing return to the ER for a recheck.   If your depression becomes uncontrolled or you feel you are a danger to yourself or others, please return immediately for reevaluation.    Depression Depression refers to feeling sad, low, down in the dumps, blue, gloomy, or empty. In general, there are two kinds of depression:  Normal sadness or normal grief. This kind of depression is one that we all feel from time to time after upsetting life experiences, such as the loss of a job or the ending of a relationship. This kind of depression is considered normal, is short lived, and resolves within a few days to 2 weeks. Depression experienced after the loss of a loved one (bereavement) often lasts longer than 2 weeks but normally gets better with time.  Clinical depression. This kind of depression lasts longer than normal sadness or normal grief or interferes with your ability to function at home, at work, and in school. It also interferes with your personal relationships. It affects almost every aspect of your life. Clinical depression is an illness. Symptoms of depression can also be caused by conditions other than those mentioned above, such as:  Physical illness. Some physical illnesses, including underactive thyroid gland (hypothyroidism), severe anemia, specific types of cancer, diabetes, uncontrolled seizures, heart and lung problems, strokes, and chronic pain are commonly associated with symptoms of depression.  Side effects of some prescription medicine. In some people, certain types of medicine can cause symptoms of depression.  Substance abuse. Abuse of alcohol and illicit drugs can cause symptoms of depression. SYMPTOMS Symptoms of normal sadness and normal  grief include the following:  Feeling sad or crying for short periods of time.  Not caring about anything (apathy).  Difficulty sleeping or sleeping too much.  No longer able to enjoy the things you used to enjoy.  Desire to be by oneself all the time (social isolation).  Lack of energy or motivation.  Difficulty concentrating or remembering.  Change in appetite or weight.  Restlessness or agitation. Symptoms of clinical depression include the same symptoms of normal sadness or normal grief and also the following symptoms:  Feeling sad or crying all the time.  Feelings of guilt or worthlessness.  Feelings of hopelessness or helplessness.  Thoughts of suicide or the desire to harm yourself (suicidal ideation).  Loss of touch with reality (psychotic symptoms). Seeing or hearing things that are not real (hallucinations) or having false beliefs about your life or the people around you (delusions and paranoia). DIAGNOSIS  The diagnosis of clinical depression is usually based on how bad the symptoms are and how long they have lasted. Your health care provider will also ask you questions about your medical history and substance use to find out if physical illness, use of prescription medicine, or substance abuse is causing your depression. Your health care provider may also order blood tests. TREATMENT  Often, normal sadness and normal grief do not require treatment. However, sometimes antidepressant medicine is given for bereavement to ease the depressive symptoms until they resolve. The treatment for clinical depression depends on how bad the symptoms are but often includes antidepressant medicine, counseling with a mental health professional, or both. Your health care provider will help to determine what treatment is best for you. Depression  caused by physical illness usually goes away with appropriate medical treatment of the illness. If prescription medicine is causing depression,  talk with your health care provider about stopping the medicine, decreasing the dose, or changing to another medicine. Depression caused by the abuse of alcohol or illicit drugs goes away when you stop using these substances. Some adults need professional help in order to stop drinking or using drugs. SEEK IMMEDIATE MEDICAL CARE IF:  You have thoughts about hurting yourself or others.  You lose touch with reality (have psychotic symptoms).  You are taking medicine for depression and have a serious side effect. FOR MORE INFORMATION  National Alliance on Mental Illness: www.nami.[employer] of Mental Health: https://carter.com/ Document Released: 10/23/2000 Document Revised: 03/12/2014 Document Reviewed: 01/25/2012 [facility] Patient Information 2015 [place], [place]. This information is not intended to replace advice given to you by your health care provider. Make sure you discuss any questions you have with your health care provider.  Muscle Pain Muscle pain (myalgia) may be caused by many things, including:  Overuse or muscle strain, especially if you are not in shape. This is the most common cause of muscle pain.  Injury.  Bruises.  Viruses, such as the flu.  Infectious diseases.  Fibromyalgia, which is a chronic condition that causes muscle tenderness, fatigue, and headache.  Autoimmune diseases, including lupus.  Certain drugs, including ACE inhibitors and statins. Muscle pain may be mild or severe. In most cases, the pain lasts only a short time and goes away without treatment. To diagnose the cause of your muscle pain, your health care provider will take your medical history. This means he or she will ask you when your muscle pain began and what has been happening. If you have not had muscle pain for very long, your health care provider may want to wait before doing much testing. If your muscle pain has lasted a long time, your health care provider may want to run  tests right away. If your health care provider thinks your muscle pain may be caused by illness, you may need to have additional tests to rule out certain conditions.  Treatment for muscle pain depends on the cause. Home care is often enough to relieve muscle pain. Your health care provider may also prescribe anti-inflammatory medicine. HOME CARE INSTRUCTIONS Watch your condition for any changes. The following actions may help to lessen any discomfort you are feeling:  Only take over-the-counter or prescription medicines as directed by your health care provider.  Apply ice to the sore muscle:  Put ice in a plastic bag.  Place a towel between your skin and the bag.  Leave the ice on for 15-20 minutes, 3-4 times a day.  You may alternate applying hot and cold packs to the muscle as directed by your health care provider.  If overuse is causing your muscle pain, slow down your activities until the pain goes away.  Remember that it is normal to feel some muscle pain after starting a workout program. Muscles that have not been used often will be sore at first.  Do regular, gentle exercises if you are not usually active.  Warm up before exercising to lower your risk of muscle pain.  Do not continue working out if the pain is very bad. Bad pain could mean you have injured a muscle. SEEK MEDICAL CARE IF:  Your muscle pain gets worse, and medicines do not help.  You have muscle pain that lasts longer than 3 days.  You have a rash or fever along with muscle pain.  You have muscle pain after a tick bite.  You have muscle pain while working out, even though you are in good physical condition.  You have redness, soreness, or swelling along with muscle pain.  You have muscle pain after starting a new medicine or changing the dose of a medicine. SEEK IMMEDIATE MEDICAL CARE IF:  You have trouble breathing.  You have trouble swallowing.  You have muscle pain along with a stiff neck,  fever, and vomiting.  You have severe muscle weakness or cannot move part of your body. MAKE SURE YOU:   Understand these instructions.  Will watch your condition.  Will get help right away if you are not doing well or get worse. Document Released: 09/17/2006 Document Revised: 10/31/2013 Document Reviewed: 08/22/2013 [facility] Patient Information 2015 [place], [place]. This information is not intended to replace advice given to you by your health care provider. Make sure you discuss any questions you have with your health care provider.

## 2015-05-06 NOTE — ED Provider Notes (Signed)
CSN: [id number]     Arrival date & time 05/06/15  4967 History   First MD Initiated Contact with Patient 05/06/15 [phone]     Chief Complaint  Patient presents with  . Foot Pain  . Leg Pain     (Consider location/radiation/quality/duration/timing/severity/associated sxs/prior Treatment) HPI   Pt presents with one month of body aches and cramping all over, lightheadedness, weight loss, decreased appetite, increased stress and depression.  Has had some mild nasal congestion and dry cough, frequent epigastric cramping, burning dysuria (this week only), abnormal vaginal discharge is thick and white.  S/P hysterectomy for fibroids.  Her husband died last year and she is working 3 jobs, states she feels her "depression is just getting started." Denies SI.    Past Medical History  Diagnosis Date  . Iron deficiency   . Hypertension     chronic  . GERD (gastroesophageal reflux disease)    Past Surgical History  Procedure Laterality Date  . Abdominal hysterectomy    . Laparoscopy     Family History  Problem Relation Age of Onset  . Cancer    . Diabetes    . Hypertension     History  Substance Use Topics  . Smoking status: Current Every Day Smoker -- 0.50 packs/day    Types: Cigarettes  . Smokeless tobacco: Not on file  . Alcohol Use: Yes     Comment: 2 beers per day   OB History    No data available     Review of Systems  All other systems reviewed and are negative.     Allergies  Codeine  Home Medications   Prior to Admission medications   Medication Sig Start Date End Date Taking? Authorizing Provider  ibuprofen (ADVIL,MOTRIN) 200 MG tablet Take 400 mg by mouth every 6 (six) hours as needed for mild pain or moderate pain.   Yes Historical Provider, MD  hydrochlorothiazide (HYDRODIURIL) 25 MG tablet Take 1 tablet (25 mg total) by mouth daily. Patient not taking: Reported on 05/06/2015 10/10/12   Nishant Dhungel, MD  losartan (COZAAR) 50 MG tablet Take 1 tablet (50 mg  total) by mouth daily. Patient not taking: Reported on 05/06/2015 10/10/12   Nishant Dhungel, MD   BP 179/75 mmHg  Pulse 70  Temp(Src) 98.2 F (36.8 C) (Oral)  Resp 20  Ht 5' 5.5" (1.664 m)  Wt 114 lb (51.71 kg)  BMI 18.68 kg/m2  SpO2 100% Physical Exam  Constitutional: She appears well-developed and well-nourished. No distress.  HENT:  Head: Normocephalic and atraumatic.  Neck: Neck supple.  Cardiovascular: Normal rate and regular rhythm.   Pulmonary/Chest: Effort normal and breath sounds normal. No respiratory distress. She has no wheezes. She has no rales.  Abdominal: Soft. She exhibits no distension. There is tenderness in the epigastric area. There is no rebound and no guarding.  Genitourinary:  Thick white discharge in vaginal vault.  S/P hysterectomy, cervix is absent.    Musculoskeletal: She exhibits no edema.  Neurological: She is alert. She exhibits normal muscle tone.  CN II-XII intact, EOMs intact, no pronator drift, grip strengths equal bilaterally; strength 5/5 in all extremities, sensation intact in all extremities; finger to nose, heel to shin, rapid alternating movements normal; gait is normal.    Skin: She is not diaphoretic.  Nursing note and vitals reviewed.   ED Course  Procedures (including critical care time) Labs Review Labs Reviewed  WET PREP, GENITAL - Abnormal; Notable for the following:    Trich, Wet  Prep MANY (*)    WBC, Wet Prep HPF POC FEW (*)    All other components within normal limits  COMPREHENSIVE METABOLIC PANEL - Abnormal; Notable for the following:    AST 49 (*)    All other components within normal limits  URINALYSIS, ROUTINE W REFLEX MICROSCOPIC (NOT AT [facility]) - Abnormal; Notable for the following:    Leukocytes, UA SMALL (*)    All other components within normal limits  LIPASE, BLOOD - Abnormal; Notable for the following:    Lipase 20 (*)    All other components within normal limits  URINE MICROSCOPIC-ADD ON - Abnormal; Notable for  the following:    Squamous Epithelial / LPF FEW (*)    All other components within normal limits  CBC  HIV ANTIBODY (ROUTINE TESTING)  GC/CHLAMYDIA PROBE AMP ([place]) NOT AT [facility]    Imaging Review Dg Chest 2 View  05/06/2015   CLINICAL DATA:  Body aches  EXAM: CHEST  2 VIEW  COMPARISON:  02/13/2013  FINDINGS: Heart size is normal. Mediastinal shadows are normal. The lungs are clear. No effusions. No bony abnormalities.  IMPRESSION: Normal chest   Electronically Signed   By: Nelson Chimes M.D.   On: 05/06/2015 07:23     EKG Interpretation None      MDM   Final diagnoses:  Myalgia  Depression    Afebrile nontoxic patient with diffuse muscle aching and cramping, lightheadedness, decreased appetite, weight loss, with worsening depression about the same period of time.  Labs unremarkable.  UA does not appear infected.  wet prep with trichomonas, treated in ED.  CXR negative. Pt advised sexual partner must also be treated.  Pt referred to primary care for further testing and counseling for grief/depression.  Pt given multiple resources by Saintclair Halsted, community liaison.  Discussed result, findings, treatment, and follow up  with patient.  Pt given return precautions.  Pt verbalizes understanding and agrees with plan.        Clayton Bibles, PA-C 05/06/15 1200  Everlene Balls, MD 05/06/15 1414

## 2015-05-07 ENCOUNTER — Inpatient Hospital Stay: Payer: No Typology Code available for payment source | Admitting: Family Medicine

## 2015-05-07 NOTE — Progress Notes (Signed)
[facility] [phone]  Pt called this morning (05/07/15 @ 11:00am) in regards to an appointment scheduled by Lucia Bitter RN, at the [facility] to establish care. Pt states she needs to reschedule the appointment made for today. I instructed pt to call the clinic to see if appointment can be rescheduled for another time. No other [place] [facility]ist needs identified at this time.

## 2015-05-09 ENCOUNTER — Ambulatory Visit: Payer: No Typology Code available for payment source | Attending: Family Medicine | Admitting: Physician Assistant

## 2015-05-09 VITALS — BP 144/76 | HR 82 | Temp 98.3°F | Resp 16 | Wt 109.0 lb

## 2015-05-09 DIAGNOSIS — F32A Depression, unspecified: Secondary | ICD-10-CM

## 2015-05-09 DIAGNOSIS — F329 Major depressive disorder, single episode, unspecified: Secondary | ICD-10-CM | POA: Insufficient documentation

## 2015-05-09 MED ORDER — SERTRALINE HCL 50 MG PO TABS: 50.0000 mg | ORAL_TABLET | Freq: Every day | ORAL | Status: DC

## 2015-05-09 MED ORDER — GABAPENTIN 100 MG PO CAPS: 100.0000 mg | ORAL_CAPSULE | Freq: Three times a day (TID) | ORAL | Status: DC

## 2015-05-09 NOTE — Addendum Note (Signed)
Addended by: Aquilla Solian E on: 05/09/2015 04:38 PM   Modules accepted: Orders, Medications

## 2015-05-09 NOTE — Progress Notes (Signed)
Kaylee Allison  [id number]  [id number]  DOB - 03/06/1971  Chief Complaint  Patient presents with  . Hospitalization Follow-up  . Depression       Subjective:   Kaylee Allison is a 44 y.o. female here today for establishment of care.  She was in the emergency department on 05/06/2015. In January 2015 she suffered the untimely death of her husband. He had a massive heart attack. She's had a tough time since that time with loss of appetite. She's also had difficulty falling asleep. She is constantly feeling drained. She's not had any suicidal or homicidal ideation. She's been more tearful. She's noticed her hair falling out over the last 6 months. She's noticed about a 20 pound weight loss over the last 6 months.  The emergency department that visit was for some epigastric discomfort that she had been having , dysuria, and vaginal discharge. She was diagnosed with Trichomonas and treated. Her urinalysis was okay. Her chest x-ray was okay. Her HIV screening was negative.   She also states that she  Has pain and numbness as well as tingling in bilateral feet on the plantar aspect.  No falls. No injuries.  She is a smoker.  ROS: GEN: denies fever or chills, decrease in weight Skin: denies lesions or rashes +hair loss HEENT: denies headache, earache, epistaxis, sore throat, or neck pain EXT: denies muscle spasms or swelling; no pain in lower ext, no weakness NEURO: + numbness or tingling, denies sz, stroke or TIA   ALLERGIES: Allergies  Allergen Reactions  . Codeine Nausea And Vomiting    PAST MEDICAL HISTORY: Past Medical History  Diagnosis Date  . Iron deficiency   . Hypertension     chronic  . GERD (gastroesophageal reflux disease)     PAST SURGICAL HISTORY: Past Surgical History  Procedure Laterality Date  . Abdominal hysterectomy    . Laparoscopy      MEDICATIONS AT HOME: Prior to Admission medications   Medication Sig Start Date End Date Taking?  Authorizing Provider  hydrochlorothiazide (HYDRODIURIL) 25 MG tablet Take 1 tablet (25 mg total) by mouth daily. 05/06/15  Yes Leonard Schwartz, MD  ibuprofen (ADVIL,MOTRIN) 200 MG tablet Take 400 mg by mouth every 6 (six) hours as needed for mild pain or moderate pain.   Yes Historical Provider, MD  losartan (COZAAR) 50 MG tablet Take 1 tablet (50 mg total) by mouth daily. 05/06/15  Yes Leonard Schwartz, MD  gabapentin (NEURONTIN) 100 MG capsule Take 1 capsule (100 mg total) by mouth 3 (three) times daily. 05/09/15   Brayton Caves, PA-C  sertraline (ZOLOFT) 50 MG tablet Take 1 tablet (50 mg total) by mouth daily. 05/09/15   Brayton Caves, PA-C     Objective:   Filed Vitals:   05/09/15 1605  BP: 144/76  Pulse: 82  Temp: 98.3 F (36.8 C)  TempSrc: Oral  Resp: 16  Weight: 109 lb (49.442 kg)  SpO2: 99%    Exam General appearance : Awake, alert, not in any distress. Speech Clear. Not toxic looking HEENT: Atraumatic and Normocephalic, pupils equally reactive to light and accomodation Lungs:clear CV: RRR no murmur Extremities: B/L Lower Ext shows no edema, both legs are warm to touch Neurology: Awake alert, and oriented X 3, CN II-XII intact, Non focal     Assessment & Plan  1. Depression  -Zoloft  -check TSH 2. Peripheral neuropathy  -Neurontin   -Smoking cessation 3. HTN  -Cont current meds    Return in  about 4 weeks (around 06/06/2015).  The patient was given clear instructions to go to ER or return to medical center if symptoms don't improve, worsen or new problems develop. The patient verbalized understanding. The patient was told to call to get lab results if they haven't heard anything in the next week.   This note has been created with Surveyor, quantity. Any transcriptional errors are unintentional.    Zettie Pho, PA-C [facility] [place], [place]   05/09/2015, 4:23 PM

## 2015-05-09 NOTE — Progress Notes (Signed)
Pt is here for hosp f/u... States she has been grieving the loss of her husband last year and has noticed wt loss Alert, no signs of acute distress.

## 2015-05-10 LAB — TSH: TSH: 3.125 u[IU]/mL (ref 0.350–4.500)

## 2015-06-07 ENCOUNTER — Ambulatory Visit: Payer: No Typology Code available for payment source

## 2016-05-07 ENCOUNTER — Emergency Department (HOSPITAL_COMMUNITY)
Admission: EM | Admit: 2016-05-07 | Discharge: 2016-05-07 | Disposition: A | Discharge status: Home / Self Care | Payer: No Typology Code available for payment source | Attending: Emergency Medicine | Admitting: Emergency Medicine

## 2016-05-07 ENCOUNTER — Encounter (HOSPITAL_COMMUNITY): Payer: Self-pay | Admitting: *Deleted

## 2016-05-07 DIAGNOSIS — I10 Essential (primary) hypertension: Secondary | ICD-10-CM | POA: Insufficient documentation

## 2016-05-07 DIAGNOSIS — Y929 Unspecified place or not applicable: Secondary | ICD-10-CM | POA: Insufficient documentation

## 2016-05-07 DIAGNOSIS — Y9389 Activity, other specified: Secondary | ICD-10-CM | POA: Insufficient documentation

## 2016-05-07 DIAGNOSIS — S61219A Laceration without foreign body of unspecified finger without damage to nail, initial encounter: Secondary | ICD-10-CM

## 2016-05-07 DIAGNOSIS — X58XXXA Exposure to other specified factors, initial encounter: Secondary | ICD-10-CM | POA: Insufficient documentation

## 2016-05-07 DIAGNOSIS — F17210 Nicotine dependence, cigarettes, uncomplicated: Secondary | ICD-10-CM | POA: Insufficient documentation

## 2016-05-07 DIAGNOSIS — S61217A Laceration without foreign body of left little finger without damage to nail, initial encounter: Secondary | ICD-10-CM | POA: Insufficient documentation

## 2016-05-07 DIAGNOSIS — Z79899 Other long term (current) drug therapy: Secondary | ICD-10-CM | POA: Insufficient documentation

## 2016-05-07 DIAGNOSIS — Y999 Unspecified external cause status: Secondary | ICD-10-CM | POA: Insufficient documentation

## 2016-05-07 MED ORDER — TETANUS-DIPHTH-ACELL PERTUSSIS 5-2.5-18.5 LF-MCG/0.5 IM SUSP
0.5000 mL | Freq: Once | INTRAMUSCULAR | Status: AC
  Administered 2016-05-07: 0.5 mL via INTRAMUSCULAR
  Filled 2016-05-07: qty 0.5

## 2016-05-07 MED ORDER — BACITRACIN ZINC 500 UNIT/GM EX OINT: 1.0000 "application " | TOPICAL_OINTMENT | Freq: Two times a day (BID) | CUTANEOUS | Status: DC

## 2016-05-07 MED ORDER — NAPROXEN 250 MG PO TABS: 250.0000 mg | ORAL_TABLET | Freq: Two times a day (BID) | ORAL | Status: DC

## 2016-05-07 NOTE — ED Notes (Signed)
Pt states she was moving her patio furniture last night and cut her left hand pinky pinky on a metal piece. Pt unsure of last tetanus shot. States the laceration started bleeding again today.

## 2016-05-07 NOTE — ED Provider Notes (Signed)
CSN: [id number]     Arrival date & time 05/07/16  1552 History  By signing my name below, I, Kaylee Allison, attest that this documentation has been prepared under the direction and in the presence of Waynetta Pean, PA-C. Electronically Signed: Rayna Allison, ED Scribe. 05/07/2016. 5:05 PM.   Chief Complaint  Patient presents with  . Extremity Laceration   The history is provided by the patient. No language interpreter was used.    HPI Comments: Kaylee Allison is a 45 y.o. female who presents to the Emergency Department complaining of a mild laceration with controlled bleeding to the dorsal aspect of her left 5th finger which occurred last night. Pt was moving patio furniture last night and accidentally cut her finger on a loose piece of exposed metal. She reports associated, moderate pain surrounding the wound and mild tingling in the affected finger. Her pain worsens with movement and palpation of the affected finger. She is unsure of the timing of her most recent tetanus vaccination. Pt denies numbness and weakness. She denies having a PCP.    Past Medical History  Diagnosis Date  . Iron deficiency   . Hypertension     chronic  . GERD (gastroesophageal reflux disease)    Past Surgical History  Procedure Laterality Date  . Abdominal hysterectomy    . Laparoscopy     Family History  Problem Relation Age of Onset  . Cancer    . Diabetes    . Hypertension     Social History  Substance Use Topics  . Smoking status: Current Every Day Smoker -- 1.00 packs/day    Types: Cigarettes  . Smokeless tobacco: None  . Alcohol Use: 0.0 oz/week    0 Standard drinks or equivalent per week     Comment: 2 beers per day   OB History    No data available     Review of Systems  Skin: Positive for wound. Negative for color change.  Neurological: Negative for weakness and numbness.    Allergies  Codeine  Home Medications   Prior to Admission medications   Medication Sig Start  Date End Date Taking? Authorizing Provider  bacitracin ointment Apply 1 application topically 2 (two) times daily. 05/07/16   Waynetta Pean, PA-C  gabapentin (NEURONTIN) 100 MG capsule Take 1 capsule (100 mg total) by mouth 3 (three) times daily. 05/09/15   Brayton Caves, PA-C  hydrochlorothiazide (HYDRODIURIL) 25 MG tablet Take 1 tablet (25 mg total) by mouth daily. 05/06/15   Leonard Schwartz, MD  ibuprofen (ADVIL,MOTRIN) 200 MG tablet Take 400 mg by mouth every 6 (six) hours as needed for mild pain or moderate pain.    Historical Provider, MD  losartan (COZAAR) 50 MG tablet Take 1 tablet (50 mg total) by mouth daily. 05/06/15   Leonard Schwartz, MD  naproxen (NAPROSYN) 250 MG tablet Take 1 tablet (250 mg total) by mouth 2 (two) times daily with a meal. 05/07/16   Waynetta Pean, PA-C  sertraline (ZOLOFT) 50 MG tablet Take 1 tablet (50 mg total) by mouth daily. 05/09/15   Tiffany Daneil Dan, PA-C   BP 176/91 mmHg  Pulse 89  Temp(Src) 98 F (36.7 C) (Oral)  Resp 16  Ht 5\' 6"  (1.676 m)  Wt 49.102 kg  BMI 17.48 kg/m2  SpO2 100%    Physical Exam  Constitutional: She appears well-developed and well-nourished. No distress.  HENT:  Head: Normocephalic and atraumatic.  Eyes: Right eye exhibits no discharge. Left eye exhibits no  discharge.  Cardiovascular: Normal rate, regular rhythm and intact distal pulses.   Bilateral radial pulses are intact. Good capillary refill to her left distal fingertips.  Pulmonary/Chest: Effort normal. No respiratory distress.  Neurological: She is alert. Coordination normal.  No numbness or weakness surrounding or distal to the wound.   Skin: Skin is warm and dry. Laceration noted. No rash noted. She is not diaphoretic.  1.5 cm laceration over the dorsal aspect of the left fifth finger proximal to the PIP joint; bleeding controlled; no discharge; good strength; no evidence of tendon injury  Psychiatric: She has a normal mood and affect. Her behavior is normal.  Nursing note  and vitals reviewed.   ED Course  Wound repair Date/Time: 05/07/2016 5:00 PM Performed by: Waynetta Pean Authorized by: Waynetta Pean Consent: Verbal consent obtained. Risks and benefits: risks, benefits and alternatives were discussed Consent given by: patient Patient understanding: patient states understanding of the procedure being performed Patient consent: the patient's understanding of the procedure matches consent given Procedure consent: procedure consent matches procedure scheduled Relevant documents: relevant documents present and verified Test results: test results available and properly labeled Site marked: the operative site was marked Required items: required blood products, implants, devices, and special equipment available Patient identity confirmed: verbally with patient Time out: Immediately prior to procedure a "time out" was called to verify the correct patient, procedure, equipment, support staff and site/side marked as required. Local anesthesia used: no Patient sedated: no Patient tolerance: Patient tolerated the procedure well with no immediate complications Comments: Wound repair and cleaning of finger laceration.     DIAGNOSTIC STUDIES: Oxygen Saturation is 100% on RA, normal by my interpretation.    COORDINATION OF CARE: 5:03 PM Discussed next steps with pt. Pt verbalized understanding and is agreeable with the plan.   Labs Review Labs Reviewed - No data to display  Imaging Review No results found.   EKG Interpretation None      Filed Vitals:   05/07/16 1608  BP: 176/91  Pulse: 89  Temp: 98 F (36.7 C)  TempSrc: Oral  Resp: 16  Height: 5\' 6"  (1.676 m)  Weight: 49.102 kg  SpO2: 100%     MDM   Meds given in ED:  Medications  Tdap (BOOSTRIX) injection 0.5 mL (not administered)    New Prescriptions   BACITRACIN OINTMENT    Apply 1 application topically 2 (two) times daily.   NAPROXEN (NAPROSYN) 250 MG TABLET    Take 1 tablet  (250 mg total) by mouth 2 (two) times daily with a meal.    Final diagnoses:  Finger laceration, initial encounter   This is a 45 y.o. female who presents to the Emergency Department complaining of a mild laceration with controlled bleeding to the dorsal aspect of her left 5th finger which occurred last night. Pt was moving patio furniture last night and accidentally cut her finger on a loose piece of exposed metal. She reports associated, moderate pain surrounding the wound and mild tingling in the affected finger. Her pain worsens with movement and palpation of the affected finger. She is unsure of the timing of her most recent tetanus vaccination. Pt denies numbness and weakness.  On exam patient is afebrile nontoxic appearing. She has a 1.5 cm laceration over the dorsal aspect of her left fifth finger just proximal to her PIP joint. The laceration is well approximated. Bleeding is controlled. No evidence of vascular damage or tendon involvement. No bony point tenderness. Patient has good strength  to her left little finger with flexion and extension. I believe the patient has continued bleeding as she is moving her pinky and this is reopening the wound. As the injury was more than 8 hours old we will not be able to suture this. Will cover with antibiotic gauze and place and finger splint to help promote healing. I encouraged her to follow-up with primary care or at urgent care for wound recheck to ensure this is healing well. I advised to watch for signs of infection. Patient's tetanus was updated in the emergency department. I advised the patient to follow-up with their primary care provider this week. I advised the patient to return to the emergency department with new or worsening symptoms or new concerns. The patient verbalized understanding and agreement with plan.    I personally performed the services described in this documentation, which was scribed in my presence. The recorded information has  been reviewed and is accurate.       Waynetta Pean, PA-C 05/07/16 [place], MD 05/11/16 2202

## 2016-05-07 NOTE — ED Notes (Signed)
Left pinky finger cleaned with saline and betadine solution.

## 2016-05-07 NOTE — Discharge Instructions (Signed)
Nonsutured Laceration Care °A laceration is a cut that goes through all layers of the skin and extends into the tissue that is right under the skin. This type of cut is usually stitched up (sutured) or closed with tape (adhesive strips) or skin glue shortly after the injury happens. °However, if the wound is dirty or if several hours pass before medical treatment is provided, it is likely that germs (bacteria) will enter the wound. Closing a laceration after bacteria have entered it increases the risk of infection. In these cases, your health care provider may leave the laceration open (nonsutured) and cover it with a bandage. This type of treatment helps prevent infection and allows the wound to heal from the deepest layer of tissue damage up to the surface. °An open fracture is a type of injury that may involve nonsutured lacerations. An open fracture is a break in a bone that happens along with one or more lacerations through the skin that is near the fracture site. °HOW TO CARE FOR YOUR NONSUTURED LACERATION °· Take or apply over-the-counter and prescription medicines only as told by your health care provider. °· If you were prescribed an antibiotic medicine, take or apply it as told by your health care provider. Do not stop using the antibiotic even if your condition improves. °· Clean the wound one time each day or as told by your health care provider. °¨ Wash the wound with mild soap and water. °¨ Rinse the wound with water to remove all soap. °¨ Pat your wound dry with a clean towel. Do not rub the wound. °· Do not inject anything into the wound unless your health care provider told you to. °· Change any bandages (dressings) as told by your health care provider. This includes changing the dressing if it gets wet, dirty, or starts to smell bad. °· Keep the dressing dry until your health care provider says it can be removed. Do not take baths, swim, or do anything that puts your wound underwater until your  health care provider approves. °· Raise (elevate) the injured area above the level of your heart while you are sitting or lying down, if possible. °· Do not scratch or pick at the wound. °· Check your wound every day for signs of infection. Watch for: °¨ Redness, swelling, or pain. °¨ Fluid, blood, or pus. °· Keep all follow-up visits as told by your health care provider. This is important. °SEEK MEDICAL CARE IF: °· You received a tetanus and shot and you have swelling, severe pain, redness, or bleeding at the injection site.   °· You have a fever. °· Your pain is not controlled with medicine. °· You have increased redness, swelling, or pain at the site of your wound. °· You have fluid, blood, or pus coming from your wound. °· You notice a bad smell coming from your wound or your dressing. °· You notice something coming out of the wound, such as wood or glass. °· You notice a change in the color of your skin near your wound. °· You develop a new rash. °· You need to change the dressing frequently due to fluid, blood, or pus draining from the wound. °· You develop numbness around your wound. °SEEK IMMEDIATE MEDICAL CARE IF: °· Your pain suddenly increases and is severe. °· You develop severe swelling around the wound. °· The wound is on your hand or foot and you cannot properly move a finger or toe. °· The wound is on your hand or   foot and you notice that your fingers or toes look pale or bluish. °· You have a red streak going away from your wound. °  °This information is not intended to replace advice given to you by your health care provider. Make sure you discuss any questions you have with your health care provider. °  °Document Released: 09/23/2006 Document Revised: 03/12/2015 Document Reviewed: 10/22/2014 °Elsevier Interactive Patient Education ©2016 [employer] ° °

## 2016-08-31 ENCOUNTER — Ambulatory Visit (INDEPENDENT_AMBULATORY_CARE_PROVIDER_SITE_OTHER): Payer: Managed Care, Other (non HMO)

## 2016-08-31 ENCOUNTER — Encounter: Payer: Self-pay | Admitting: Podiatry

## 2016-08-31 ENCOUNTER — Ambulatory Visit (INDEPENDENT_AMBULATORY_CARE_PROVIDER_SITE_OTHER): Payer: Managed Care, Other (non HMO) | Admitting: Podiatry

## 2016-08-31 VITALS — BP 175/87 | HR 94 | Resp 16

## 2016-08-31 DIAGNOSIS — M79609 Pain in unspecified limb: Secondary | ICD-10-CM | POA: Diagnosis not present

## 2016-08-31 DIAGNOSIS — M7751 Other enthesopathy of right foot: Secondary | ICD-10-CM | POA: Diagnosis not present

## 2016-08-31 DIAGNOSIS — L851 Acquired keratosis [keratoderma] palmaris et plantaris: Secondary | ICD-10-CM

## 2016-08-31 DIAGNOSIS — L603 Nail dystrophy: Secondary | ICD-10-CM | POA: Diagnosis not present

## 2016-08-31 DIAGNOSIS — M7752 Other enthesopathy of left foot: Secondary | ICD-10-CM | POA: Diagnosis not present

## 2016-08-31 DIAGNOSIS — M79672 Pain in left foot: Secondary | ICD-10-CM | POA: Diagnosis not present

## 2016-08-31 DIAGNOSIS — L84 Corns and callosities: Secondary | ICD-10-CM

## 2016-08-31 DIAGNOSIS — Z79899 Other long term (current) drug therapy: Secondary | ICD-10-CM | POA: Diagnosis not present

## 2016-08-31 DIAGNOSIS — M779 Enthesopathy, unspecified: Secondary | ICD-10-CM

## 2016-08-31 DIAGNOSIS — L608 Other nail disorders: Secondary | ICD-10-CM

## 2016-08-31 DIAGNOSIS — M79671 Pain in right foot: Secondary | ICD-10-CM | POA: Diagnosis not present

## 2016-08-31 DIAGNOSIS — B351 Tinea unguium: Secondary | ICD-10-CM

## 2016-08-31 DIAGNOSIS — M778 Other enthesopathies, not elsewhere classified: Secondary | ICD-10-CM

## 2016-08-31 NOTE — Progress Notes (Signed)
   Subjective:    Patient ID: Kaylee Allison, female    DOB: 1971/09/15, 45 y.o.   MRN: [id number]  HPI    Review of Systems  Constitutional: Positive for fatigue and unexpected weight change.  Eyes: Positive for redness.  Cardiovascular: Positive for leg swelling.  Musculoskeletal: Positive for myalgias.  Hematological: Bruises/bleeds easily.  Psychiatric/Behavioral: The patient is nervous/anxious.   All other systems reviewed and are negative.      Objective:   Physical Exam        Assessment & Plan:

## 2016-09-01 LAB — HEPATIC FUNCTION PANEL
ALT: 18 U/L (ref 6–29)
AST: 31 U/L (ref 10–35)
Albumin: 4.2 g/dL (ref 3.6–5.1)
Alkaline Phosphatase: 108 U/L (ref 33–115)
Bilirubin, Direct: 0.1 mg/dL (ref <=0.2)
Indirect Bilirubin: 0.3 mg/dL (ref 0.2–1.2)
Total Bilirubin: 0.4 mg/dL (ref 0.2–1.2)
Total Protein: 7.4 g/dL (ref 6.1–8.1)

## 2016-09-14 MED ORDER — BETAMETHASONE SOD PHOS & ACET 6 (3-3) MG/ML IJ SUSP: 3.0000 mg | Freq: Once | INTRAMUSCULAR | Status: DC

## 2016-09-14 NOTE — Progress Notes (Signed)
Patient ID: Kaylee Allison, female   DOB: 04-23-1971, 45 y.o.   MRN: [id number] Subjective: Patient presents today for possible treatment and evaluation of fungal nails bilaterally 1 through 5. Patient states that the nails have been discolored and thickened for greater than 1 month. Patient also has a complaint of painful fifth digits bilaterally. Patient denies trauma. Pain has been ongoing for several weeks. Patient also complains of painful callus lesions to the plantar aspect of the forefeet bilaterally. Patient presents today for further treatment and evaluation.  Objective: Physical Exam General: The patient is alert and oriented x3 in no acute distress.  Dermatology: Hyperkeratotic skin lesions noted to the first and fifth MPJs bilaterally. Lesions present with a central nucleated core. Hyperkeratotic, discolored, thickened, onychodystrophy of nails noted bilaterally.  Skin is warm, dry and supple bilateral lower extremities. Negative for open lesions or macerations.  Vascular: Palpable pedal pulses bilaterally. No edema or erythema noted. Capillary refill within normal limits.  Neurological: Epicritic and protective threshold grossly intact bilaterally.   Musculoskeletal Exam:  Pain on palpation and range of motion to the fifth MPJs bilaterally. Range of motion within normal limits to all pedal and ankle joints bilateral. Muscle strength 5/5 in all groups bilateral.   Assessment: #1 onychodystrophy bilateral toenails #2 possible onychomycosis #3 hyperkeratotic nails bilateral #4 porokeratosis first and fifth MPJs bilateral #5 capsulitis fifth MPJs bilateral  Plan of Care:  #1 Patient was evaluated. #2 Orders for liver function tests were ordered today.  #3 Today nail biopsy was taken and sent to pathology for fungal culture. #5 injection of 0.5 mL Celestone Soluspan injected in the fifth MPJs bilaterally #6 excisional debridement of porokeratotic lesions was performed using a  chisel blade to lesions of the first and fifth MPJs bilateral  #7 patient is to return to clinic in 4 weeks to discuss fungal culture nail biopsy findings and LFT results and discuss different treatment options.  Dr. Edrick Kins, [place]

## 2016-09-28 ENCOUNTER — Ambulatory Visit (INDEPENDENT_AMBULATORY_CARE_PROVIDER_SITE_OTHER): Payer: Managed Care, Other (non HMO) | Admitting: Podiatry

## 2016-09-28 DIAGNOSIS — L608 Other nail disorders: Secondary | ICD-10-CM

## 2016-09-28 DIAGNOSIS — L851 Acquired keratosis [keratoderma] palmaris et plantaris: Secondary | ICD-10-CM | POA: Diagnosis not present

## 2016-09-28 DIAGNOSIS — L603 Nail dystrophy: Secondary | ICD-10-CM | POA: Diagnosis not present

## 2016-09-28 DIAGNOSIS — M79671 Pain in right foot: Secondary | ICD-10-CM | POA: Diagnosis not present

## 2016-09-28 DIAGNOSIS — B351 Tinea unguium: Secondary | ICD-10-CM

## 2016-09-28 DIAGNOSIS — M79672 Pain in left foot: Secondary | ICD-10-CM

## 2016-09-28 DIAGNOSIS — M79609 Pain in unspecified limb: Secondary | ICD-10-CM

## 2016-09-28 DIAGNOSIS — L84 Corns and callosities: Secondary | ICD-10-CM

## 2016-09-28 MED ORDER — NONFORMULARY OR COMPOUNDED ITEM: 1.0000 [drp] | Freq: Every day | 2 refills | Status: DC

## 2016-09-28 MED ORDER — TERBINAFINE HCL 250 MG PO TABS: 250.0000 mg | ORAL_TABLET | Freq: Every day | ORAL | 2 refills | Status: DC

## 2016-10-18 NOTE — Progress Notes (Signed)
Subjective: Patient presents today for follow-up evaluation of possible onychomycosis to the bilateral lower extremities. Last office visit nail biopsy was taken and sent for fungal culture. Liver function tests were ordered. Patient presents today for follow-up treatment and evaluation. Patient also presents for painful callus lesions to the weightbearing surfaces of the bilateral feet. Patient states that they hurt in shoe gear and she is unable walk without pain. Patient presents for further treatment and evaluation  Objective:  Physical Exam General: Alert and oriented x3 in no acute distress  Dermatology: Hyperkeratotic lesion present on the weightbearing surfaces of the urgent fifth MPJs bilateral lower extremities.. Pain on palpation with a central nucleated core noted.  Skin is warm, dry and supple bilateral lower extremities. Negative for open lesions or macerations. Onychomycosis noted bilateral lower extremities digits 1-5  Vascular: Palpable pedal pulses bilaterally. No edema or erythema noted. Capillary refill within normal limits.  Neurological: Epicritic and protective threshold grossly intact bilaterally.   Musculoskeletal Exam: Pain on palpation at the keratotic lesion noted. Range of motion within normal limits bilateral. Muscle strength 5/5 in all groups bilateral.  Assessment: #1 onychomycosis digits 1-5 bilateral lower extremities #2 painful porokeratotic lesions weightbearing surfaces of the first and fifth MPJ bilateral #3 pain in bilateral feet   Plan of Care:  #1 Patient evaluated. Today we discussed fungal culture results which were positive for nail fungus. Also reviewed patient's liver function test which were within normal limits. #2 Excisional debridement of  keratoic lesion using a chisel blade was performed without incident.  #3 Treated area(s) with Salinocaine and dressed with light dressing. #4 prescription for terbinafine 250 mg was 90 days #5  prescription for antifungal nail lacquer dispensed through [facility]  #6 Patient is to return to the clinic in 4 months    Edrick Kins, DPM [facility]  Dr. Edrick Kins, [address]                                        [place], [place] [address]                Office [phone]  Fax [phone]

## 2016-12-30 ENCOUNTER — Ambulatory Visit: Payer: Managed Care, Other (non HMO) | Admitting: Podiatry

## 2017-05-29 ENCOUNTER — Emergency Department (HOSPITAL_COMMUNITY)
Admission: EM | Admit: 2017-05-29 | Discharge: 2017-05-29 | Disposition: A | Discharge status: Home / Self Care | Payer: No Typology Code available for payment source | Attending: Emergency Medicine | Admitting: Emergency Medicine

## 2017-05-29 ENCOUNTER — Emergency Department (HOSPITAL_COMMUNITY): Payer: No Typology Code available for payment source

## 2017-05-29 ENCOUNTER — Encounter (HOSPITAL_COMMUNITY): Payer: Self-pay

## 2017-05-29 DIAGNOSIS — Y929 Unspecified place or not applicable: Secondary | ICD-10-CM | POA: Insufficient documentation

## 2017-05-29 DIAGNOSIS — Z79899 Other long term (current) drug therapy: Secondary | ICD-10-CM | POA: Insufficient documentation

## 2017-05-29 DIAGNOSIS — M79671 Pain in right foot: Secondary | ICD-10-CM

## 2017-05-29 DIAGNOSIS — W01198A Fall on same level from slipping, tripping and stumbling with subsequent striking against other object, initial encounter: Secondary | ICD-10-CM | POA: Insufficient documentation

## 2017-05-29 DIAGNOSIS — I10 Essential (primary) hypertension: Secondary | ICD-10-CM | POA: Insufficient documentation

## 2017-05-29 DIAGNOSIS — F17210 Nicotine dependence, cigarettes, uncomplicated: Secondary | ICD-10-CM | POA: Insufficient documentation

## 2017-05-29 DIAGNOSIS — Y939 Activity, unspecified: Secondary | ICD-10-CM | POA: Insufficient documentation

## 2017-05-29 DIAGNOSIS — Y998 Other external cause status: Secondary | ICD-10-CM | POA: Insufficient documentation

## 2017-05-29 DIAGNOSIS — Z7982 Long term (current) use of aspirin: Secondary | ICD-10-CM | POA: Insufficient documentation

## 2017-05-29 DIAGNOSIS — S96911A Strain of unspecified muscle and tendon at ankle and foot level, right foot, initial encounter: Secondary | ICD-10-CM | POA: Insufficient documentation

## 2017-05-29 DIAGNOSIS — S93401A Sprain of unspecified ligament of right ankle, initial encounter: Secondary | ICD-10-CM

## 2017-05-29 MED ORDER — ACETAMINOPHEN 500 MG PO TABS
1000.0000 mg | ORAL_TABLET | Freq: Once | ORAL | Status: AC
  Administered 2017-05-29: 1000 mg via ORAL
  Filled 2017-05-29: qty 2

## 2017-05-29 MED ORDER — IBUPROFEN 800 MG PO TABS
800.0000 mg | ORAL_TABLET | Freq: Once | ORAL | Status: AC
  Administered 2017-05-29: 800 mg via ORAL
  Filled 2017-05-29: qty 1

## 2017-05-29 NOTE — Progress Notes (Signed)
Orthopedic Tech Progress Note Patient Details:  Kaylee Allison 19-Nov-1970 [id number]  Ortho Devices Type of Ortho Device: ASO Ortho Device/Splint Location: RLE Ortho Device/Splint Interventions: Ordered, Application   Braulio Bosch 05/29/2017, 4:39 PM

## 2017-05-29 NOTE — ED Provider Notes (Signed)
[facility] DEPT Provider Note   CSN: [id number] Arrival date & time: 05/29/17  1424     History   Chief Complaint Chief Complaint  Patient presents with  . Ankle Pain    HPI Kaylee Allison is a 46 y.o. female.  HPI  Past Medical History:  Diagnosis Date  . GERD (gastroesophageal reflux disease)   . Hypertension    chronic  . Iron deficiency     Patient Active Problem List   Diagnosis Date Noted  . Chest pain 04/02/2011  . Tobacco abuse 04/02/2011    Past Surgical History:  Procedure Laterality Date  . ABDOMINAL HYSTERECTOMY    . LAPAROSCOPY      OB History    No data available       Home Medications    Prior to Admission medications   Medication Sig Start Date End Date Taking? Authorizing Provider  aspirin EC 81 MG tablet Take 81 mg by mouth daily.   Yes [provider]  gabapentin (NEURONTIN) 100 MG capsule Take 1 capsule (100 mg total) by mouth 3 (three) times daily. 05/09/15  Yes Ena Dawley, Tiffany S, PA-C  losartan (COZAAR) 50 MG tablet Take 1 tablet (50 mg total) by mouth daily. 05/06/15  Yes Leonard Schwartz, MD  Multiple Vitamins-Minerals (MULTIVITAMIN PO) Take 1 tablet by mouth daily.   Yes [provider]  hydrochlorothiazide (HYDRODIURIL) 25 MG tablet Take 1 tablet (25 mg total) by mouth daily. Patient not taking: Reported on 05/29/2017 05/06/15   Leonard Schwartz, MD  ibuprofen (ADVIL,MOTRIN) 200 MG tablet Take 400 mg by mouth every 6 (six) hours as needed for mild pain or moderate pain.    [provider]  naproxen (NAPROSYN) 250 MG tablet Take 1 tablet (250 mg total) by mouth 2 (two) times daily with a meal. Patient not taking: Reported on 05/29/2017 05/07/16   Waynetta Pean, PA-C  NONFORMULARY OR COMPOUNDED ITEM Apply 1 drop topically daily. Nail Lacquer: Fluconazole 2% Terbinafine 1% DMSO Patient not taking: Reported on 05/29/2017 09/28/16   Edrick Kins, DPM  terbinafine (LAMISIL) 250 MG tablet Take 1 tablet (250 mg  total) by mouth daily. Patient not taking: Reported on 05/29/2017 09/28/16   Edrick Kins, DPM    Family History Family History  Problem Relation Age of Onset  . Cancer Unknown   . Diabetes Unknown   . Hypertension Unknown     Social History Social History  Substance Use Topics  . Smoking status: Current Every Day Smoker    Packs/day: 1.00    Types: Cigarettes  . Smokeless tobacco: Not on file  . Alcohol use 0.0 oz/week     Comment: 2 beers per day     Allergies   Codeine   Review of Systems Review of Systems   Physical Exam Updated Vital Signs BP 135/74 (BP Location: Right Arm)   Pulse 92   Temp 98.1 F (36.7 C) (Oral)   Resp 18   SpO2 100%   Physical Exam   ED Treatments / Results  Labs (all labs ordered are listed, but only abnormal results are displayed) Labs Reviewed - No data to display  EKG  EKG Interpretation None       Radiology Dg Ankle Complete Right  Result Date: 05/29/2017 CLINICAL DATA:  Caught foot between 2 bricks while walking, twisting medially, RIGHT foot and ankle pain after incident, swelling at ankle and dorsum of foot EXAM: RIGHT ANKLE - COMPLETE 3+ VIEW COMPARISON:  None. FINDINGS: Osseous  mineralization normal. Mild soft tissue swelling diffusely. Ankle joint space preserved. No acute fracture, dislocation, or bone destruction. IMPRESSION: No acute osseous abnormalities. Electronically Signed   By: Lavonia Dana M.D.   On: 05/29/2017 14:57   Dg Foot Complete Right  Result Date: 05/29/2017 CLINICAL DATA:  Caught foot between 2 bricks while walking, twisting medially, RIGHT foot and ankle pain after incident, swelling at ankle and dorsum of foot EXAM: RIGHT FOOT COMPLETE - 3+ VIEW COMPARISON:  None ; prior exam on the time line from 08/31/2016 does not load for comparison. FINDINGS: Osseous mineralization normal. Mild scattered soft tissue swelling especially at dorsum of midfoot. No acute fracture, dislocation, or bone destruction.  IMPRESSION: No acute osseous abnormalities. Electronically Signed   By: Lavonia Dana M.D.   On: 05/29/2017 14:58    Procedures Procedures (including critical care time)  Medications Ordered in ED Medications  ibuprofen (ADVIL,MOTRIN) tablet 800 mg (800 mg Oral Given 05/29/17 1533)  acetaminophen (TYLENOL) tablet 1,000 mg (1,000 mg Oral Given 05/29/17 1533)     Initial Impression / Assessment and Plan / ED Course  I have reviewed the triage vital signs and the nursing notes.  Pertinent labs & imaging results that were available during my care of the patient were reviewed by me and considered in my medical decision making (see chart for details).     Plain films of right foot and ankle were negative for fracture. RICE, ankle brace and crutches.  Final Clinical Impressions(s) / ED Diagnoses   Final diagnoses:  Sprain of right ankle, unspecified ligament, initial encounter  Right foot pain    New Prescriptions New Prescriptions   No medications on file     Nat Christen, MD 06/02/17 1623

## 2017-05-29 NOTE — Discharge Instructions (Signed)
X-ray showed no fracture. Rest, ice, elevate, ankle. Tylenol and/or ibuprofen for pain

## 2017-05-29 NOTE — ED Notes (Signed)
See EDP secondary assessment.  

## 2017-05-29 NOTE — ED Provider Notes (Signed)
[facility] DEPT Provider Note   CSN: [id number] Arrival date & time: 05/29/17  1424     History   Chief Complaint Chief Complaint  Patient presents with  . Ankle Pain    HPI Kaylee Allison is a 46 y.o. female.  Right ankle and right foot pain after tripping over brick today. No other injuries.  Severity of pain is moderate. Palpation and range of motion make pain worse.      Past Medical History:  Diagnosis Date  . GERD (gastroesophageal reflux disease)   . Hypertension    chronic  . Iron deficiency     Patient Active Problem List   Diagnosis Date Noted  . Chest pain 04/02/2011  . Tobacco abuse 04/02/2011    Past Surgical History:  Procedure Laterality Date  . ABDOMINAL HYSTERECTOMY    . LAPAROSCOPY      OB History    No data available       Home Medications    Prior to Admission medications   Medication Sig Start Date End Date Taking? Authorizing Provider  aspirin EC 81 MG tablet Take 81 mg by mouth daily.   Yes [provider]  gabapentin (NEURONTIN) 100 MG capsule Take 1 capsule (100 mg total) by mouth 3 (three) times daily. 05/09/15  Yes Ena Dawley, Tiffany S, PA-C  losartan (COZAAR) 50 MG tablet Take 1 tablet (50 mg total) by mouth daily. 05/06/15  Yes Leonard Schwartz, MD  Multiple Vitamins-Minerals (MULTIVITAMIN PO) Take 1 tablet by mouth daily.   Yes [provider]  hydrochlorothiazide (HYDRODIURIL) 25 MG tablet Take 1 tablet (25 mg total) by mouth daily. Patient not taking: Reported on 05/29/2017 05/06/15   Leonard Schwartz, MD  ibuprofen (ADVIL,MOTRIN) 200 MG tablet Take 400 mg by mouth every 6 (six) hours as needed for mild pain or moderate pain.    [provider]  naproxen (NAPROSYN) 250 MG tablet Take 1 tablet (250 mg total) by mouth 2 (two) times daily with a meal. Patient not taking: Reported on 05/29/2017 05/07/16   Waynetta Pean, PA-C  NONFORMULARY OR COMPOUNDED ITEM Apply 1 drop topically daily. Nail Lacquer:  Fluconazole 2% Terbinafine 1% DMSO Patient not taking: Reported on 05/29/2017 09/28/16   Edrick Kins, DPM  terbinafine (LAMISIL) 250 MG tablet Take 1 tablet (250 mg total) by mouth daily. Patient not taking: Reported on 05/29/2017 09/28/16   Edrick Kins, DPM    Family History Family History  Problem Relation Age of Onset  . Cancer Unknown   . Diabetes Unknown   . Hypertension Unknown     Social History Social History  Substance Use Topics  . Smoking status: Current Every Day Smoker    Packs/day: 1.00    Types: Cigarettes  . Smokeless tobacco: Not on file  . Alcohol use 0.0 oz/week     Comment: 2 beers per day     Allergies   Codeine   Review of Systems Review of Systems  All other systems reviewed and are negative.    Physical Exam Updated Vital Signs BP 135/74 (BP Location: Right Arm)   Pulse 92   Temp 98.1 F (36.7 C) (Oral)   Resp 18   SpO2 100%   Physical Exam  Constitutional: She is oriented to person, place, and time. She appears well-developed and well-nourished.  HENT:  Head: Normocephalic and atraumatic.  Eyes: Conjunctivae are normal.  Neck: Neck supple.  Musculoskeletal:  Right lower extremity: Tender over lateral aspect of ankle and dorsum  of foot with moderate edema  Neurological: She is alert and oriented to person, place, and time.  Skin: Skin is warm and dry.  Psychiatric: She has a normal mood and affect. Her behavior is normal.  Nursing note and vitals reviewed.    ED Treatments / Results  Labs (all labs ordered are listed, but only abnormal results are displayed) Labs Reviewed - No data to display  EKG  EKG Interpretation None       Radiology Dg Ankle Complete Right  Result Date: 05/29/2017 CLINICAL DATA:  Caught foot between 2 bricks while walking, twisting medially, RIGHT foot and ankle pain after incident, swelling at ankle and dorsum of foot EXAM: RIGHT ANKLE - COMPLETE 3+ VIEW COMPARISON:  None. FINDINGS: Osseous  mineralization normal. Mild soft tissue swelling diffusely. Ankle joint space preserved. No acute fracture, dislocation, or bone destruction. IMPRESSION: No acute osseous abnormalities. Electronically Signed   By: Lavonia Dana M.D.   On: 05/29/2017 14:57   Dg Foot Complete Right  Result Date: 05/29/2017 CLINICAL DATA:  Caught foot between 2 bricks while walking, twisting medially, RIGHT foot and ankle pain after incident, swelling at ankle and dorsum of foot EXAM: RIGHT FOOT COMPLETE - 3+ VIEW COMPARISON:  None ; prior exam on the time line from 08/31/2016 does not load for comparison. FINDINGS: Osseous mineralization normal. Mild scattered soft tissue swelling especially at dorsum of midfoot. No acute fracture, dislocation, or bone destruction. IMPRESSION: No acute osseous abnormalities. Electronically Signed   By: Lavonia Dana M.D.   On: 05/29/2017 14:58    Procedures Procedures (including critical care time)  Medications Ordered in ED Medications  ibuprofen (ADVIL,MOTRIN) tablet 800 mg (800 mg Oral Given 05/29/17 1533)  acetaminophen (TYLENOL) tablet 1,000 mg (1,000 mg Oral Given 05/29/17 1533)     Initial Impression / Assessment and Plan / ED Course  I have reviewed the triage vital signs and the nursing notes.  Pertinent labs & imaging results that were available during my care of the patient were reviewed by me and considered in my medical decision making (see chart for details).     Plain films of right foot and right ankle were negative. RICE.  Final Clinical Impressions(s) / ED Diagnoses   Final diagnoses:  Sprain of right ankle, unspecified ligament, initial encounter  Right foot pain    New Prescriptions New Prescriptions   No medications on file     Nat Christen, MD 05/29/17 1615

## 2017-05-29 NOTE — Progress Notes (Signed)
Orthopedic Tech Progress Note Patient Details:  Kaylee Allison 1971/10/24 [id number]  Ortho Devices Type of Ortho Device: Crutches Ortho Device/Splint Location: RLE Ortho Device/Splint Interventions: Ordered, Adjustment   Braulio Bosch 05/29/2017, 4:44 PM

## 2017-05-29 NOTE — ED Triage Notes (Signed)
Patient complains of right ankle and right foot pain after tripping over brick today. Pain with any ROM, no obvious deformity

## 2017-06-02 ENCOUNTER — Emergency Department (HOSPITAL_COMMUNITY)
Admission: EM | Admit: 2017-06-02 | Discharge: 2017-06-02 | Disposition: A | Discharge status: Home / Self Care | Payer: No Typology Code available for payment source | Attending: Emergency Medicine | Admitting: Emergency Medicine

## 2017-06-02 ENCOUNTER — Encounter (HOSPITAL_COMMUNITY): Payer: Self-pay | Admitting: *Deleted

## 2017-06-02 DIAGNOSIS — I10 Essential (primary) hypertension: Secondary | ICD-10-CM | POA: Insufficient documentation

## 2017-06-02 DIAGNOSIS — W010XXD Fall on same level from slipping, tripping and stumbling without subsequent striking against object, subsequent encounter: Secondary | ICD-10-CM | POA: Insufficient documentation

## 2017-06-02 DIAGNOSIS — Z7982 Long term (current) use of aspirin: Secondary | ICD-10-CM | POA: Insufficient documentation

## 2017-06-02 DIAGNOSIS — S93601D Unspecified sprain of right foot, subsequent encounter: Secondary | ICD-10-CM | POA: Insufficient documentation

## 2017-06-02 DIAGNOSIS — Z0279 Encounter for issue of other medical certificate: Secondary | ICD-10-CM | POA: Insufficient documentation

## 2017-06-02 DIAGNOSIS — Z79899 Other long term (current) drug therapy: Secondary | ICD-10-CM | POA: Insufficient documentation

## 2017-06-02 DIAGNOSIS — F17210 Nicotine dependence, cigarettes, uncomplicated: Secondary | ICD-10-CM | POA: Insufficient documentation

## 2017-06-02 NOTE — ED Provider Notes (Signed)
[facility] DEPT Provider Note   CSN: [id number] Arrival date & time: 06/02/17  1035     History   Chief Complaint Chief Complaint  Patient presents with  . work note    HPI Kaylee Allison is a 46 y.o. female.  HPI   Patient is a 46 year old female with history of hypertension who presents the ED for work no. Patient reports 4 days ago she tripped over a brick resulting in pain to her right foot and ankle. She notes she was seen in the ED and discharged home with an ankle brace and crutches but notes she did not receive a work note. 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 She notes she has tried to ambulate on her foot since last being seen but notes she has had associated pain and has had to continue using her crutches. Denies any other recent fall, trauma or injury. Denies fever, redness, numbness.  Past Medical History:  Diagnosis Date  . GERD (gastroesophageal reflux disease)   . Hypertension    chronic  . Iron deficiency     Patient Active Problem List   Diagnosis Date Noted  . Chest pain 04/02/2011  . Tobacco abuse 04/02/2011    Past Surgical History:  Procedure Laterality Date  . ABDOMINAL HYSTERECTOMY    . LAPAROSCOPY      OB History    No data available       Home Medications    Prior to Admission medications   Medication Sig Start Date End Date Taking? Authorizing Provider  aspirin EC 81 MG tablet Take 81 mg by mouth daily.    [provider]  gabapentin (NEURONTIN) 100 MG capsule Take 1 capsule (100 mg total) by mouth 3 (three) times daily. 05/09/15   Brayton Caves, PA-C  hydrochlorothiazide (HYDRODIURIL) 25 MG tablet Take 1 tablet (25 mg total) by mouth daily. Patient not taking: Reported on 05/29/2017 05/06/15   Leonard Schwartz, MD  ibuprofen (ADVIL,MOTRIN) 200 MG tablet Take 400 mg by mouth every 6 (six) hours as needed for mild pain or moderate pain.    [provider]  losartan (COZAAR) 50 MG tablet Take 1 tablet (50 mg total)  by mouth daily. 05/06/15   Leonard Schwartz, MD  Multiple Vitamins-Minerals (MULTIVITAMIN PO) Take 1 tablet by mouth daily.    [provider]  naproxen (NAPROSYN) 250 MG tablet Take 1 tablet (250 mg total) by mouth 2 (two) times daily with a meal. Patient not taking: Reported on 05/29/2017 05/07/16   Waynetta Pean, PA-C  NONFORMULARY OR COMPOUNDED ITEM Apply 1 drop topically daily. Nail Lacquer: Fluconazole 2% Terbinafine 1% DMSO Patient not taking: Reported on 05/29/2017 09/28/16   Edrick Kins, DPM  terbinafine (LAMISIL) 250 MG tablet Take 1 tablet (250 mg total) by mouth daily. Patient not taking: Reported on 05/29/2017 09/28/16   Edrick Kins, DPM    Family History Family History  Problem Relation Age of Onset  . Cancer Unknown   . Diabetes Unknown   . Hypertension Unknown     Social History Social History  Substance Use Topics  . Smoking status: Current Every Day Smoker    Packs/day: 1.00    Types: Cigarettes  . Smokeless tobacco: Never Used  . Alcohol use 0.0 oz/week     Comment: 2 beers per day     Allergies   Codeine   Review of Systems Review of Systems  Constitutional: Negative for fever.  Musculoskeletal: Positive for arthralgias (right foot  and ankle). Negative for joint swelling.  Skin: Negative for wound.  Neurological: Negative for weakness and numbness.     Physical Exam Updated Vital Signs BP (!) 196/93   Pulse 90   Temp 98.1 F (36.7 C) (Oral)   Resp 18   SpO2 100%   Physical Exam  Constitutional: She is oriented to person, place, and time. She appears well-developed and well-nourished. No distress.  HENT:  Head: Normocephalic and atraumatic.  Eyes: Conjunctivae and EOM are normal. Right eye exhibits no discharge. Left eye exhibits no discharge. No scleral icterus.  Neck: Normal range of motion. Neck supple.  Cardiovascular: Normal rate and intact distal pulses.   Pulmonary/Chest: Effort normal.  Musculoskeletal: She exhibits  tenderness. She exhibits no edema or deformity.  TTP over right forefoot and ankle with decreased active ROM due to reported pain. No swelling, erythema, warmth present. FROM of right knee, no TTP over proximal tib/fib.  Neurological: She is alert and oriented to person, place, and time.  Skin: Skin is warm and dry. Capillary refill takes less than 2 seconds. She is not diaphoretic.  Nursing note and vitals reviewed.    ED Treatments / Results  Labs (all labs ordered are listed, but only abnormal results are displayed) Labs Reviewed - No data to display  EKG  EKG Interpretation None       Radiology No results found.  Procedures Procedures (including critical care time)  Medications Ordered in ED Medications - No data to display   Initial Impression / Assessment and Plan / ED Course  I have reviewed the triage vital signs and the nursing notes.  Pertinent labs & imaging results that were available during my care of the patient were reviewed by me and considered in my medical decision making (see chart for details).     Patient presents for work now. Reports being seen in the ED on 05/29/17 for initial injury due to tripping over a brick. X-rays performed at that time revealed negative right foot and ankle. Patient was discharged home with ankle brace and crutches. 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 On exam patient with continued tenderness over right foot and ankle, right lower extremity otherwise neurovascularly intact. Patient discharged home with a work note and advised to follow-up with orthopedist as needed. Discussed return precautions. Discussed continue symptomatic treatment including RICE protocol.  Final Clinical Impressions(s) / ED Diagnoses   Final diagnoses:  Sprain of right foot, subsequent encounter    New Prescriptions New Prescriptions   No medications on file     Renold Don 06/02/17 1218    Daleen Bo, MD 06/03/17 1118

## 2017-06-02 NOTE — Discharge Instructions (Signed)
Take Ibuprofen 600mg  every 6 hours as needed for pain relief. Continue to rest, elevate and ice your foot for 15-20 minutes 3-4 times daily. I recommend continuing to use her crutches for the next week. Follow-up with the orthopedic clinic as listed if your pain has not improved in the next 1-2 weeks. Please return to the Emergency Department if symptoms worsen or new onset of fever, redness, swelling, numbness, weakness.

## 2017-06-02 NOTE — ED Triage Notes (Signed)
Pt seen here on Saturday and she needs to be out of work for 2weeks and it is not her discharge summary. Just needs a work note

## 2017-06-02 NOTE — ED Notes (Signed)
Pt is in stable condition upon d/c and is escorted from ED via wheelchair. 

## 2018-01-28 ENCOUNTER — Other Ambulatory Visit: Payer: Self-pay | Admitting: Family Medicine

## 2018-01-28 DIAGNOSIS — Z1231 Encounter for screening mammogram for malignant neoplasm of breast: Secondary | ICD-10-CM

## 2018-02-09 ENCOUNTER — Encounter: Payer: Self-pay | Admitting: Podiatry

## 2018-02-09 ENCOUNTER — Ambulatory Visit (INDEPENDENT_AMBULATORY_CARE_PROVIDER_SITE_OTHER): Payer: No Typology Code available for payment source | Admitting: Podiatry

## 2018-02-09 DIAGNOSIS — B351 Tinea unguium: Secondary | ICD-10-CM | POA: Diagnosis not present

## 2018-02-09 DIAGNOSIS — Q828 Other specified congenital malformations of skin: Secondary | ICD-10-CM | POA: Diagnosis not present

## 2018-02-09 MED ORDER — TERBINAFINE HCL 250 MG PO TABS: 250.0000 mg | ORAL_TABLET | Freq: Every day | ORAL | 0 refills | Status: DC

## 2018-02-09 MED FILL — TERBINAFINE HCL 250 MG TABS: 250 | 30 days supply | Qty: 30 | Fill #0

## 2018-02-11 NOTE — Progress Notes (Signed)
   Subjective: 47 year old female presenting today for follow up evaluation of callus lesions of bilateral feet as well as onychomycosis of all toenails. She reports associated dry skin to the plantar aspects of bilateral heels. She was prescribed Lamisil in the past but never took it due to insurance issues. She has not done anything for treatment. Walking and bearing weight increase the pain. Patient is here for further evaluation and treatment.   Past Medical History:  Diagnosis Date  . GERD (gastroesophageal reflux disease)   . Hypertension    chronic  . Iron deficiency      Objective:  Physical Exam General: Alert and oriented x3 in no acute distress  Dermatology: Hyperkeratotic lesions present on the bilateral feet x 5. Pain on palpation with a central nucleated core noted. Skin is warm, dry and supple bilateral lower extremities. Negative for open lesions or macerations. Onychomycosis noted to bilateral feet digits 1-5.  Vascular: Palpable pedal pulses bilaterally. No edema or erythema noted. Capillary refill within normal limits.  Neurological: Epicritic and protective threshold grossly intact bilaterally.   Musculoskeletal Exam: Pain on palpation at the keratotic lesion noted. Range of motion within normal limits bilateral. Muscle strength 5/5 in all groups bilateral.  Assessment: #1 onychomycosis digits 1-5 bilateral  #2 porokeratosis bilateral feet x 5 #3 pain in bilateral feet   Plan of Care:  #1 Patient evaluated.  #2 Excisional debridement of keratoic lesion using a chisel blade was performed without incident. Dressed with a light dressing.  #3 Another prescription for Lamisil 250 mg #90 provided to patient. She did not get prescription from last visit. #4 Return to clinic as needed.    Edrick Kins, DPM [facility]  Dr. Edrick Kins, [address]                                        [place], [place] [address]                  Office [phone]  Fax [phone]

## 2018-02-17 ENCOUNTER — Ambulatory Visit: Payer: No Typology Code available for payment source

## 2019-02-16 ENCOUNTER — Encounter (HOSPITAL_COMMUNITY): Payer: Self-pay | Admitting: Urgent Care

## 2019-02-16 ENCOUNTER — Other Ambulatory Visit: Payer: Self-pay

## 2019-02-16 ENCOUNTER — Ambulatory Visit (HOSPITAL_COMMUNITY)
Admission: EM | Admit: 2019-02-16 | Discharge: 2019-02-16 | Disposition: A | Discharge status: Home / Self Care | Payer: Self-pay | Attending: Urgent Care | Admitting: Urgent Care

## 2019-02-16 DIAGNOSIS — R058 Other specified cough: Secondary | ICD-10-CM

## 2019-02-16 DIAGNOSIS — R030 Elevated blood-pressure reading, without diagnosis of hypertension: Secondary | ICD-10-CM

## 2019-02-16 DIAGNOSIS — R0989 Other specified symptoms and signs involving the circulatory and respiratory systems: Secondary | ICD-10-CM

## 2019-02-16 DIAGNOSIS — I10 Essential (primary) hypertension: Secondary | ICD-10-CM

## 2019-02-16 DIAGNOSIS — R05 Cough: Secondary | ICD-10-CM

## 2019-02-16 DIAGNOSIS — B9789 Other viral agents as the cause of diseases classified elsewhere: Secondary | ICD-10-CM

## 2019-02-16 DIAGNOSIS — J069 Acute upper respiratory infection, unspecified: Secondary | ICD-10-CM

## 2019-02-16 MED ORDER — BENZONATATE 100 MG PO CAPS: 100.0000 mg | ORAL_CAPSULE | Freq: Three times a day (TID) | ORAL | 0 refills | Status: DC | PRN

## 2019-02-16 MED ORDER — PROMETHAZINE-DM 6.25-15 MG/5ML PO SYRP: 5.0000 mL | ORAL_SOLUTION | Freq: Three times a day (TID) | ORAL | 0 refills | Status: DC | PRN

## 2019-02-16 NOTE — ED Provider Notes (Signed)
MRN: [id number] DOB: 1971-06-20  Subjective:   Kaylee Allison is a 48 y.o. female presenting for 1 week history of acute onset productive cough with associated chest congestion worse during the day but not bad at night. Feels like she is improving. Smokes ~1 ppd. Denies history of asthma, COPD. Has a history of HTN. She did not take her bp medications this morning. Works third shift.   No current facility-administered medications for this encounter.   Current Outpatient Medications:  .  aspirin EC 81 MG tablet, Take 81 mg by mouth daily., Disp: , Rfl:  .  gabapentin (NEURONTIN) 100 MG capsule, Take 1 capsule (100 mg total) by mouth 3 (three) times daily., Disp: 90 capsule, Rfl: 3 .  hydrochlorothiazide (HYDRODIURIL) 25 MG tablet, Take 1 tablet (25 mg total) by mouth daily. (Patient not taking: Reported on 05/29/2017), Disp: 30 tablet, Rfl: 5 .  ibuprofen (ADVIL,MOTRIN) 200 MG tablet, Take 400 mg by mouth every 6 (six) hours as needed for mild pain or moderate pain., Disp: , Rfl:  .  losartan (COZAAR) 50 MG tablet, Take 1 tablet (50 mg total) by mouth daily., Disp: 30 tablet, Rfl: 5 .  Multiple Vitamins-Minerals (MULTIVITAMIN PO), Take 1 tablet by mouth daily., Disp: , Rfl:  .  NONFORMULARY OR COMPOUNDED ITEM, Apply 1 drop topically daily. Nail Lacquer: Fluconazole 2% Terbinafine 1% DMSO (Patient not taking: Reported on 05/29/2017), Disp: 120 each, Rfl: 2    Allergies  Allergen Reactions  . Codeine Nausea And Vomiting    Past Medical History:  Diagnosis Date  . GERD (gastroesophageal reflux disease)   . Hypertension    chronic  . Iron deficiency      Past Surgical History:  Procedure Laterality Date  . ABDOMINAL HYSTERECTOMY    . LAPAROSCOPY      Review of Systems  Constitutional: Negative for fever and malaise/fatigue.  HENT: Negative for ear pain, sinus pain and sore throat.   Eyes: Negative for blurred vision, double vision, discharge and redness.  Respiratory: Negative  for hemoptysis, shortness of breath and wheezing.   Cardiovascular: Negative for chest pain.  Gastrointestinal: Negative for abdominal pain, diarrhea, nausea and vomiting.  Genitourinary: Negative for dysuria, flank pain and hematuria.  Musculoskeletal: Negative for myalgias.  Skin: Negative for rash.  Neurological: Positive for headaches. Negative for weakness.  Psychiatric/Behavioral: Negative for depression and substance abuse.    Objective:   Vitals: BP (!) 209/74 (BP Location: Right Arm)   Pulse 89   Temp 98.3 F (36.8 C) (Oral)   Resp 16   SpO2 100%   Physical Exam Constitutional:      General: She is not in acute distress.    Appearance: Normal appearance. She is well-developed. She is not ill-appearing, toxic-appearing or diaphoretic.  HENT:     Head: Normocephalic and atraumatic.     Right Ear: Tympanic membrane and ear canal normal. No drainage or tenderness. No middle ear effusion. Tympanic membrane is not erythematous.     Left Ear: Tympanic membrane and ear canal normal. No drainage or tenderness.  No middle ear effusion. Tympanic membrane is not erythematous.     Nose: Nose normal. No congestion or rhinorrhea.     Mouth/Throat:     Mouth: Mucous membranes are moist. No oral lesions.     Pharynx: Oropharynx is clear. No pharyngeal swelling, oropharyngeal exudate, posterior oropharyngeal erythema or uvula swelling.     Tonsils: No tonsillar exudate or tonsillar abscesses.  Eyes:  Extraocular Movements: Extraocular movements intact.     Right eye: Normal extraocular motion.     Left eye: Normal extraocular motion.     Conjunctiva/sclera: Conjunctivae normal.     Pupils: Pupils are equal, round, and reactive to light.  Neck:     Musculoskeletal: Normal range of motion and neck supple.  Cardiovascular:     Rate and Rhythm: Normal rate and regular rhythm.     Pulses: Normal pulses.     Heart sounds: Normal heart sounds. No murmur. No friction rub. No gallop.    Pulmonary:     Effort: Pulmonary effort is normal. No respiratory distress.     Breath sounds: Normal breath sounds. No stridor. No wheezing, rhonchi or rales.  Lymphadenopathy:     Cervical: No cervical adenopathy.  Skin:    General: Skin is warm and dry.     Findings: No rash.  Neurological:     General: No focal deficit present.     Mental Status: She is alert and oriented to person, place, and time.  Psychiatric:        Mood and Affect: Mood normal.        Behavior: Behavior normal.        Thought Content: Thought content normal.     Assessment and Plan :   Productive cough  Chest congestion  Essential hypertension  Elevated blood pressure reading  Viral URI with cough  Likely viral in etiology d/t reassuring physical exam findings.  Counseled on possibility of covid-19 and the fact that I cannot rule this out.  Advised isolation at home.  Patient does have minimal risk factors including no travel, no fever no shortness of breath.  She also denies coming into contact with any confirmed cases of coronavirus.  Advised supportive care, offered symptomatic relief. Counseled patient on potential for adverse effects with medications prescribed today, patient verbalized understanding.  Emphasized compliance with her blood pressure medications.  She is to monitor her BP.  Strict ER and return-to-clinic precautions discussed, patient verbalized understanding.  Otherwise follow-up with PCP.    Jaynee Eagles, PA-C 02/16/19 1235

## 2019-02-16 NOTE — ED Triage Notes (Signed)
Pt present cough with chest congestion. Symptoms started about a week ago.  Pt has tried OTC medication with some relief.  Pt BP is elevated, pt states she did not take her blood pressure pill this morning.

## 2019-02-16 NOTE — Discharge Instructions (Addendum)
We will manage this as a viral syndrome. For sore throat or cough try using a honey-based tea. Use 3 teaspoons of honey with juice squeezed from half lemon. Place shaved pieces of ginger into 1/2-1 cup of water and warm over stove top. Then mix the ingredients and repeat every 4 hours as needed. Please take Tylenol 500mg  every 6 hours. Hydrate very well with at least 2 liters of water. Eat light meals such as soups to replenish electrolytes and soft fruits, veggies. Start an antihistamine like Zyrtec, Allegra or Claritin.

## 2019-04-13 ENCOUNTER — Ambulatory Visit (HOSPITAL_COMMUNITY)
Admission: EM | Admit: 2019-04-13 | Discharge: 2019-04-13 | Disposition: A | Discharge status: Home / Self Care | Payer: Self-pay | Attending: Urgent Care | Admitting: Urgent Care

## 2019-04-13 ENCOUNTER — Encounter (HOSPITAL_COMMUNITY): Payer: Self-pay

## 2019-04-13 DIAGNOSIS — M549 Dorsalgia, unspecified: Secondary | ICD-10-CM

## 2019-04-13 DIAGNOSIS — S5002XA Contusion of left elbow, initial encounter: Secondary | ICD-10-CM

## 2019-04-13 DIAGNOSIS — M25552 Pain in left hip: Secondary | ICD-10-CM

## 2019-04-13 DIAGNOSIS — W109XXA Fall (on) (from) unspecified stairs and steps, initial encounter: Secondary | ICD-10-CM

## 2019-04-13 DIAGNOSIS — W19XXXA Unspecified fall, initial encounter: Secondary | ICD-10-CM

## 2019-04-13 DIAGNOSIS — S8002XA Contusion of left knee, initial encounter: Secondary | ICD-10-CM

## 2019-04-13 DIAGNOSIS — I160 Hypertensive urgency: Secondary | ICD-10-CM

## 2019-04-13 DIAGNOSIS — I10 Essential (primary) hypertension: Secondary | ICD-10-CM

## 2019-04-13 MED ORDER — TRAMADOL HCL 50 MG PO TABS: 50.0000 mg | ORAL_TABLET | Freq: Three times a day (TID) | ORAL | 0 refills | Status: DC | PRN

## 2019-04-13 MED ORDER — LOSARTAN POTASSIUM 50 MG PO TABS: 50.0000 mg | ORAL_TABLET | Freq: Every day | ORAL | 0 refills | Status: DC

## 2019-04-13 MED ORDER — HYDROCHLOROTHIAZIDE 25 MG PO TABS: 25.0000 mg | ORAL_TABLET | Freq: Every day | ORAL | 0 refills | Status: DC

## 2019-04-13 NOTE — ED Provider Notes (Signed)
MRN: [id number] DOB: 09/04/1971  Subjective:   Kaylee Allison is a 48 y.o. female presenting for 1 week history of intermittent left sided limb pain, mid-upper back pain. Patient accidentally fell, lost her step and fell onto concrete. She landed on her left side but did not hit her head. Has had bruising of left thigh, sharp aching pain of her left elbow and aching pain of her back, mild-moderate in severity. Has tried ibuprofen, icing with minimal relief. Of note, patient ran out of her blood pressure medications several days ago.    No current facility-administered medications for this encounter.   Current Outpatient Medications:  .  aspirin EC 81 MG tablet, Take 81 mg by mouth daily., Disp: , Rfl:  .  benzonatate (TESSALON) 100 MG capsule, Take 1-2 capsules (100-200 mg total) by mouth 3 (three) times daily as needed., Disp: 60 capsule, Rfl: 0 .  gabapentin (NEURONTIN) 100 MG capsule, Take 1 capsule (100 mg total) by mouth 3 (three) times daily., Disp: 90 capsule, Rfl: 3 .  hydrochlorothiazide (HYDRODIURIL) 25 MG tablet, Take 1 tablet (25 mg total) by mouth daily. (Patient not taking: Reported on 05/29/2017), Disp: 30 tablet, Rfl: 5 .  ibuprofen (ADVIL,MOTRIN) 200 MG tablet, Take 400 mg by mouth every 6 (six) hours as needed for mild pain or moderate pain., Disp: , Rfl:  .  losartan (COZAAR) 50 MG tablet, Take 1 tablet (50 mg total) by mouth daily., Disp: 30 tablet, Rfl: 5 .  Multiple Vitamins-Minerals (MULTIVITAMIN PO), Take 1 tablet by mouth daily., Disp: , Rfl:  .  NONFORMULARY OR COMPOUNDED ITEM, Apply 1 drop topically daily. Nail Lacquer: Fluconazole 2% Terbinafine 1% DMSO (Patient not taking: Reported on 05/29/2017), Disp: 120 each, Rfl: 2 .  promethazine-dextromethorphan (PROMETHAZINE-DM) 6.25-15 MG/5ML syrup, Take 5 mLs by mouth 3 (three) times daily as needed for cough., Disp: 100 mL, Rfl: 0   Allergies  Allergen Reactions  . Codeine Nausea And Vomiting    Past Medical History:   Diagnosis Date  . GERD (gastroesophageal reflux disease)   . Hypertension    chronic  . Iron deficiency      Past Surgical History:  Procedure Laterality Date  . ABDOMINAL HYSTERECTOMY    . LAPAROSCOPY      ROS  Objective:   Vitals: BP (!) 211/83 (BP Location: Right Arm) Comment: out of meds x2 days  Pulse 97   Temp 98.6 F (37 C) (Oral)   Resp 18   SpO2 99%   Physical Exam Constitutional:      General: She is not in acute distress.    Appearance: Normal appearance. She is well-developed. She is not ill-appearing, toxic-appearing or diaphoretic.  HENT:     Head: Normocephalic and atraumatic.     Nose: Nose normal.     Mouth/Throat:     Mouth: Mucous membranes are moist.  Eyes:     Extraocular Movements: Extraocular movements intact.     Pupils: Pupils are equal, round, and reactive to light.  Cardiovascular:     Rate and Rhythm: Normal rate and regular rhythm.     Pulses: Normal pulses.     Heart sounds: Normal heart sounds. No murmur. No friction rub. No gallop.   Pulmonary:     Effort: Pulmonary effort is normal. No respiratory distress.     Breath sounds: Normal breath sounds. No stridor. No wheezing, rhonchi or rales.  Musculoskeletal:     Left elbow: She exhibits decreased range of motion (slight in all  directions due to pain) and swelling (trace). Tenderness found.     Left hip: She exhibits tenderness. She exhibits normal range of motion, normal strength, no swelling, no crepitus and no deformity.     Left knee: She exhibits normal range of motion, no swelling, no effusion, no erythema and normal patellar mobility. Tenderness found. Patellar tendon tenderness noted.     Cervical back: She exhibits normal range of motion, no tenderness, no bony tenderness, no swelling, no edema and no spasm.     Thoracic back: She exhibits normal range of motion, no tenderness, no bony tenderness, no swelling, no edema and no spasm.     Lumbar back: She exhibits normal range  of motion, no tenderness, no swelling, no edema, no laceration and no spasm.  Skin:    General: Skin is warm and dry.     Findings: No rash.  Neurological:     General: No focal deficit present.     Mental Status: She is alert and oriented to person, place, and time.     Cranial Nerves: No cranial nerve deficit.     Motor: No weakness.     Coordination: Coordination normal.     Gait: Gait normal.     Deep Tendon Reflexes: Reflexes normal.     Comments: Negative Romberg and pronator drift.   Psychiatric:        Mood and Affect: Mood normal.        Behavior: Behavior normal.        Thought Content: Thought content normal.    Assessment and Plan :   Fall, initial encounter  Contusion of left elbow, initial encounter  Upper back pain  Mid back pain  Left hip pain  Contusion of left knee, initial encounter  Essential hypertension  Hypertensive urgency  We will use conservative management to manage patient for contusions.  Counseled that she needs to have aggressive control of her blood pressure and establish care with a PCP.  In the meantime due to her severely elevated blood pressure will use tramadol for pain control. Counseled patient on potential for adverse effects with medications prescribed/recommended today, ER and return-to-clinic precautions discussed, patient verbalized understanding.    Jaynee Eagles, PA-C 04/13/19 1556

## 2019-04-13 NOTE — ED Triage Notes (Signed)
Pt states fell off her porch on concrete steps a wk ago. C/o lt arm pain and mid to upper back pain.

## 2019-05-31 ENCOUNTER — Encounter: Payer: Self-pay | Admitting: Podiatry

## 2019-05-31 ENCOUNTER — Other Ambulatory Visit: Payer: Self-pay

## 2019-05-31 ENCOUNTER — Ambulatory Visit (INDEPENDENT_AMBULATORY_CARE_PROVIDER_SITE_OTHER): Payer: Self-pay | Admitting: Podiatry

## 2019-05-31 DIAGNOSIS — L989 Disorder of the skin and subcutaneous tissue, unspecified: Secondary | ICD-10-CM

## 2019-06-07 NOTE — Progress Notes (Signed)
   Subjective: 48 y.o. female presenting to the office today for follow up evaluation of recurrent callus lesions noted to the bilateral feet. She reports the lesions need to be trimmed again because they are causing pain with ambulation. She has not had any recent treatment for the symptoms. Patient is here for further evaluation and treatment.   Past Medical History:  Diagnosis Date  . GERD (gastroesophageal reflux disease)   . Hypertension    chronic  . Iron deficiency      Objective:  Physical Exam General: Alert and oriented x3 in no acute distress  Dermatology: Hyperkeratotic lesion(s) present on the bilateral feet. Pain on palpation with a central nucleated core noted. Skin is warm, dry and supple bilateral lower extremities. Negative for open lesions or macerations.  Vascular: Palpable pedal pulses bilaterally. No edema or erythema noted. Capillary refill within normal limits.  Neurological: Epicritic and protective threshold grossly intact bilaterally.   Musculoskeletal Exam: Pain on palpation at the keratotic lesion(s) noted. Range of motion within normal limits bilateral. Muscle strength 5/5 in all groups bilateral.  Assessment: 1. 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 Patient evaluated 2. Excisional debridement of keratoic lesion using a chisel blade was performed without incident. Salinocaine applied.  3. Dressed area with light dressing. 4. Patient is to return to the clinic PRN.   Edrick Kins, DPM [facility]  Dr. Edrick Kins, [place]                                        [place],  [address]                Office [phone]  Fax [phone]

## 2019-08-05 ENCOUNTER — Emergency Department (HOSPITAL_COMMUNITY)
Admission: EM | Admit: 2019-08-05 | Discharge: 2019-08-05 | Disposition: A | Discharge status: Home / Self Care | Payer: No Typology Code available for payment source | Attending: Emergency Medicine | Admitting: Emergency Medicine

## 2019-08-05 ENCOUNTER — Other Ambulatory Visit: Payer: Self-pay

## 2019-08-05 ENCOUNTER — Emergency Department (HOSPITAL_COMMUNITY): Payer: No Typology Code available for payment source

## 2019-08-05 ENCOUNTER — Encounter (HOSPITAL_COMMUNITY): Payer: Self-pay | Admitting: *Deleted

## 2019-08-05 DIAGNOSIS — I10 Essential (primary) hypertension: Secondary | ICD-10-CM | POA: Insufficient documentation

## 2019-08-05 DIAGNOSIS — Z7982 Long term (current) use of aspirin: Secondary | ICD-10-CM | POA: Insufficient documentation

## 2019-08-05 DIAGNOSIS — K219 Gastro-esophageal reflux disease without esophagitis: Secondary | ICD-10-CM | POA: Insufficient documentation

## 2019-08-05 DIAGNOSIS — R7989 Other specified abnormal findings of blood chemistry: Secondary | ICD-10-CM | POA: Insufficient documentation

## 2019-08-05 DIAGNOSIS — F17210 Nicotine dependence, cigarettes, uncomplicated: Secondary | ICD-10-CM | POA: Insufficient documentation

## 2019-08-05 DIAGNOSIS — R0789 Other chest pain: Secondary | ICD-10-CM | POA: Insufficient documentation

## 2019-08-05 DIAGNOSIS — Z79899 Other long term (current) drug therapy: Secondary | ICD-10-CM | POA: Insufficient documentation

## 2019-08-05 DIAGNOSIS — E876 Hypokalemia: Secondary | ICD-10-CM | POA: Insufficient documentation

## 2019-08-05 DIAGNOSIS — D649 Anemia, unspecified: Secondary | ICD-10-CM | POA: Insufficient documentation

## 2019-08-05 LAB — BASIC METABOLIC PANEL
Anion gap: 7 (ref 5–15)
BUN: 15 mg/dL (ref 6–20)
CO2: 28 mmol/L (ref 22–32)
Calcium: 9.9 mg/dL (ref 8.9–10.3)
Chloride: 99 mmol/L (ref 98–111)
Creatinine, Ser: 0.7 mg/dL (ref 0.44–1.00)
GFR calc Af Amer: >60 mL/min (ref >60)
GFR calc non Af Amer: >60 mL/min (ref >60)
Glucose, Bld: 160 mg/dL — ABNORMAL HIGH (ref 70–99)
Potassium: 3.3 mmol/L — ABNORMAL LOW (ref 3.5–5.1)
Sodium: 134 mmol/L — ABNORMAL LOW (ref 135–145)

## 2019-08-05 LAB — CBC
HCT: 44.5 % (ref 36.0–46.0)
Hemoglobin: 15 g/dL (ref 12.0–15.0)
MCH: 31.5 pg (ref 26.0–34.0)
MCHC: 33.7 g/dL (ref 30.0–36.0)
MCV: 93.5 fL (ref 80.0–100.0)
Platelets: 198 10*3/uL (ref 150–400)
RBC: 4.76 MIL/uL (ref 3.87–5.11)
RDW: 14.1 % (ref 11.5–15.5)
WBC: 6.5 10*3/uL (ref 4.0–10.5)
nRBC: 0 % (ref 0.0–0.2)

## 2019-08-05 LAB — TSH: TSH: 6.506 u[IU]/mL — ABNORMAL HIGH (ref 0.350–4.500)

## 2019-08-05 LAB — I-STAT BETA HCG BLOOD, ED (MC, WL, AP ONLY): I-stat hCG, quantitative: <5 m[IU]/mL (ref <5)

## 2019-08-05 LAB — TROPONIN I (HIGH SENSITIVITY): Troponin I (High Sensitivity): 4 ng/L (ref <18)

## 2019-08-05 MED ORDER — SODIUM CHLORIDE 0.9 % IV BOLUS
1000.0000 mL | Freq: Once | INTRAVENOUS | Status: AC
  Administered 2019-08-05: 1000 mL via INTRAVENOUS

## 2019-08-05 MED ORDER — HYDROCHLOROTHIAZIDE 25 MG PO TABS: 25.0000 mg | ORAL_TABLET | Freq: Every day | ORAL | 0 refills | Status: DC

## 2019-08-05 MED ORDER — POTASSIUM CHLORIDE ER 10 MEQ PO TBCR: 10.0000 meq | EXTENDED_RELEASE_TABLET | Freq: Every day | ORAL | 0 refills | Status: DC

## 2019-08-05 MED ORDER — LOSARTAN POTASSIUM 50 MG PO TABS: 50.0000 mg | ORAL_TABLET | Freq: Every day | ORAL | 0 refills | Status: DC

## 2019-08-05 MED ORDER — SODIUM CHLORIDE 0.9% FLUSH
3.0000 mL | Freq: Once | INTRAVENOUS | Status: AC
  Administered 2019-08-05: 3 mL via INTRAVENOUS

## 2019-08-05 MED ORDER — POTASSIUM CHLORIDE CRYS ER 20 MEQ PO TBCR
40.0000 meq | EXTENDED_RELEASE_TABLET | Freq: Once | ORAL | Status: AC
  Administered 2019-08-05: 40 meq via ORAL
  Filled 2019-08-05: qty 2

## 2019-08-05 NOTE — Discharge Instructions (Addendum)
Continue taking your blood pressure medications as prescribed.  Your potassium was a little bit low today so I have prescribed tablets for you to take 1 tablet daily for the next 5 days beginning tomorrow.  Your thyroid test was abnormal which could explain many of your ongoing symptoms.  Thyroid disease can be complicated to manage so we do not typically start medications in the emergency department but it will be very important to follow-up with a primary care provider for reevaluation, to obtain additional blood work, and to potentially start medications to treat your thyroid disorder.   I have given you information for [facility].  They are specifically for people that do not have health insurance.  Please call first thing Monday at 9 AM and tell them you were referred from the emergency department.  They can schedule you at any of their multiple facilities.  If your blood pressure (BP) was elevated on multiple readings during this visit above 130 for the top number or above 80 for the bottom number, please have this repeated by your primary care provider within one month. You can also check your blood pressure when you are out at a pharmacy or grocery store. Many have machines that will check your blood pressure.  If your blood pressure remains elevated, please follow-up with your PCP.  Return to the emergency department if any concerning signs or symptoms develop such as high fevers, persistent vomiting, worsening chest pains, or loss of consciousness.

## 2019-08-05 NOTE — ED Provider Notes (Signed)
[facility] EMERGENCY DEPARTMENT Provider Note   CSN: [id number] Arrival date & time: 08/05/19  [id number]     History   Chief Complaint Chief Complaint  Patient presents with   Chest Pain    HPI Kaylee Allison is a 48 y.o. female with history of GERD, hypertension, iron deficiency anemia, tobacco abuse presents for evaluation of acute onset, constant right-sided chest pain for 3 days.  She notes the pain is a "spasming "sensation, does not radiate.  No aggravating or alleviating factors noted.  Is not exertional or pleuritic.  Some nausea but no vomiting. She reports additional symptoms that have been ongoing for several months including lightheadedness, fatigue, muscle spasms and cramps in the upper and lower extremities.  Denies back pain, bowel or bladder incontinence, saddle anesthesia, or known injury.  No fever, chills, or cough, abdominal pain, or vomiting.  Reports taking tramadol and ibuprofen for symptoms without relief.  She is a current smoker of approximately half a pack of cigarettes daily.  Ran out of her blood pressure medications yesterday morning and has not had any today.  Has not been able to follow-up with her PCP due to insurance issues.     The history is provided by the patient.    Past Medical History:  Diagnosis Date   GERD (gastroesophageal reflux disease)    Hypertension    chronic   Iron deficiency     Patient Active Problem List   Diagnosis Date Noted   Chest pain 04/02/2011   Tobacco abuse 04/02/2011    Past Surgical History:  Procedure Laterality Date   ABDOMINAL HYSTERECTOMY     LAPAROSCOPY       OB History   No obstetric history on file.      Home Medications    Prior to Admission medications   Medication Sig Start Date End Date Taking? Authorizing Provider  aspirin EC 81 MG tablet Take 81 mg by mouth daily.    [provider]  benzonatate (TESSALON) 100 MG capsule Take 1-2 capsules (100-200 mg  total) by mouth 3 (three) times daily as needed. 02/16/19   Jaynee Eagles, PA-C  gabapentin (NEURONTIN) 100 MG capsule Take 1 capsule (100 mg total) by mouth 3 (three) times daily. 05/09/15   Brayton Caves, PA-C  hydrochlorothiazide (HYDRODIURIL) 25 MG tablet Take 1 tablet (25 mg total) by mouth daily. 08/05/19   Shavonta Gossen A, PA-C  ibuprofen (ADVIL,MOTRIN) 200 MG tablet Take 400 mg by mouth every 6 (six) hours as needed for mild pain or moderate pain.    [provider]  losartan (COZAAR) 50 MG tablet Take 1 tablet (50 mg total) by mouth daily. 08/05/19   Mikiala Fugett A, PA-C  Multiple Vitamins-Minerals (MULTIVITAMIN PO) Take 1 tablet by mouth daily.    [provider]  NONFORMULARY OR COMPOUNDED ITEM Apply 1 drop topically daily. [employer]: Fluconazole 2% Terbinafine 1% DMSO 09/28/16   Evans, Dorathy Daft, DPM  potassium chloride (K-DUR) 10 MEQ tablet Take 1 tablet (10 mEq total) by mouth daily for 5 days. 08/05/19 08/10/19  Rodell Perna A, PA-C  promethazine-dextromethorphan (PROMETHAZINE-DM) 6.25-15 MG/5ML syrup Take 5 mLs by mouth 3 (three) times daily as needed for cough. 02/16/19   Jaynee Eagles, PA-C  traMADol (ULTRAM) 50 MG tablet Take 1 tablet (50 mg total) by mouth every 8 (eight) hours as needed for moderate pain or severe pain. 04/13/19   Jaynee Eagles, PA-C    Family History Family History  Problem Relation Age of Onset   Cancer Other    Diabetes Other    Hypertension Other     Social History Social History   Tobacco Use   Smoking status: Current Every Day Smoker    Packs/day: 1.00    Types: Cigarettes   Smokeless tobacco: Never Used  Substance Use Topics   Alcohol use: Yes    Alcohol/week: 0.0 standard drinks    Comment: 2 beers per day   Drug use: No     Allergies   Codeine   Review of Systems Review of Systems  Constitutional: Positive for fatigue. Negative for fever.  Respiratory: Negative for cough and shortness of breath.   Cardiovascular:  Positive for chest pain. Negative for leg swelling.  Gastrointestinal: Positive for nausea. Negative for abdominal pain and vomiting.  Musculoskeletal: Negative for back pain.  Neurological: Positive for light-headedness. Negative for syncope.  All other systems reviewed and are negative.    Physical Exam Updated Vital Signs BP (!) 155/85    Pulse 90    Temp 98 F (36.7 C) (Oral)    Resp 18    Ht 5' 5.5" (1.664 m)    Wt 52.6 kg    SpO2 100%    BMI 19.01 kg/m   Physical Exam Vitals signs and nursing note reviewed.  Constitutional:      General: She is not in acute distress.    Appearance: She is well-developed.     Comments: Resting comfortably in bed  HENT:     Head: Normocephalic and atraumatic.  Eyes:     General:        Right eye: No discharge.        Left eye: No discharge.     Conjunctiva/sclera: Conjunctivae normal.  Neck:     Musculoskeletal: Normal range of motion and neck supple.     Vascular: No JVD.     Trachea: No tracheal deviation.  Cardiovascular:     Rate and Rhythm: Normal rate and regular rhythm.     Pulses:          Radial pulses are 2+ on the right side and 2+ on the left side.       Dorsalis pedis pulses are 2+ on the right side and 2+ on the left side.       Posterior tibial pulses are 2+ on the right side and 2+ on the left side.     Heart sounds: Normal heart sounds.     Comments: Homans sign absent bilaterally, no lower extremity edema, no palpable cords, compartments are soft  Pulmonary:     Effort: Pulmonary effort is normal.  Chest:     Chest wall: No mass or crepitus.  Abdominal:     General: Bowel sounds are normal. There is no distension.     Palpations: Abdomen is soft. There is no mass.     Tenderness: There is no abdominal tenderness. There is no guarding or rebound.  Musculoskeletal:     Right lower leg: She exhibits no tenderness. No edema.     Left lower leg: She exhibits no tenderness. No edema.  Skin:    General: Skin is warm  and dry.     Findings: No erythema.  Neurological:     General: No focal deficit present.     Mental Status: She is alert.     Comments: Result extremity spontaneously without difficulty, sensation intact light touch of bilateral upper and lower extremities  Psychiatric:  Behavior: Behavior normal.      ED Treatments / Results  Labs (all labs ordered are listed, but only abnormal results are displayed) Labs Reviewed  BASIC METABOLIC PANEL - Abnormal; Notable for the following components:      Result Value   Sodium 134 (*)    Potassium 3.3 (*)    Glucose, Bld 160 (*)    All other components within normal limits  TSH - Abnormal; Notable for the following components:   TSH 6.506 (*)    All other components within normal limits  CBC  I-STAT BETA HCG BLOOD, ED ([facility], [facility], [facility] ONLY)  TROPONIN I (HIGH SENSITIVITY)    EKG EKG Interpretation  Date/Time:  Saturday August 05 2019 09:30:10 EDT Ventricular Rate:  82 PR Interval:  188 QRS Duration: 90 QT Interval:  404 QTC Calculation: 472 R Axis:   64 Text Interpretation:  Normal sinus rhythm Biatrial enlargement Left ventricular hypertrophy Cannot rule out Septal infarct , age undetermined Abnormal ECG No significant change since last tracing Confirmed by Dorie Rank [phone]) on 08/05/2019 12:06:37 PM   Radiology Dg Chest 2 View  Result Date: 08/05/2019 CLINICAL DATA:  3 day history of chest pain EXAM: CHEST - 2 VIEW COMPARISON:  None. FINDINGS: The cardiopericardial silhouette is within normal limits for size. The lungs are clear without focal pneumonia, edema, pneumothorax or pleural effusion. Stable nodular density over the right base compatible with nipple shadow period The visualized bony structures of the thorax are intact. IMPRESSION: Stable.  No acute cardiopulmonary findings. Electronically Signed   By: Misty Stanley M.D.   On: 08/05/2019 10:57    Procedures Procedures (including critical care time)  Medications  Ordered in ED Medications  sodium chloride flush (NS) 0.9 % injection 3 mL (3 mLs Intravenous Given 08/05/19 1246)  potassium chloride SA (K-DUR) CR tablet 40 mEq (40 mEq Oral Given 08/05/19 1229)  sodium chloride 0.9 % bolus 1,000 mL (1,000 mLs Intravenous New Bag/Given 08/05/19 1246)     Initial Impression / Assessment and Plan / ED Course  I have reviewed the triage vital signs and the nursing notes.  Pertinent labs & imaging results that were available during my care of the patient were reviewed by me and considered in my medical decision making (see chart for details).         Patient presenting for evaluation of right-sided chest pains for 3 days as well as additional symptoms that have been ongoing for several months.  The symptoms include muscle cramps, lightheadedness, fatigue, decreased appetite.  Patient is afebrile, blood pressure is labile in the ED; she has not had any of her hypertension medications today as she ran out of them yesterday.  Has not been able to follow-up with her PCP while waiting for her insurance to kick in.  She has normal neurologic examination, no back pain or red flag signs concerning for cauda equina or spinal abscess.  With regards to her chest pain it is quite atypical.  EKG shows no acute ischemic abnormalities, no significant changes from last tracing.  Her troponin is negative and as she has had symptoms constantly for the last 3 days I do not feel that serial troponins are necessary.  Chest x-ray shows no acute cardiopulmonary abnormalities, no evidence of pneumonia or pleural effusion.  Lab work reviewed by me shows no leukocytosis, no anemia, no renal insufficiency.  She is mildly hyponatremic and hypokalemic which could be secondary to her HCTZ and losartan.  Will give  IV fluids and replenished potassium orally in the ED and will discharge with a short course of p.o. potassium daily for 5 days.  Doubt dissection, PE, cardiac tamponade, esophageal rupture,  pneumonia, or pneumothorax.  Her TSH did come back elevated which could suggest hypothyroidism.  Encourage the patient to follow-up with her PCP as soon as possible or with 1 of the free clinics in the area for medication management and further testing.  She is overall quite well-appearing.  No further emergent work-up required in the ED at this time.  Discussed strict ED return precautions. Patient verbalized understanding of and agreement with plan and is safe for discharge home at this time.  Final Clinical Impressions(s) / ED Diagnoses   Final diagnoses:  Atypical chest pain  Hypokalemia  Hypertension, unspecified type  TSH elevation    ED Discharge Orders         Ordered    hydrochlorothiazide (HYDRODIURIL) 25 MG tablet  Daily     08/05/19 1425    losartan (COZAAR) 50 MG tablet  Daily     08/05/19 1425    potassium chloride (K-DUR) 10 MEQ tablet  Daily     08/05/19 1425           Maclane Holloran, [place] A, PA-C 08/05/19 1440    Dorie Rank, MD 08/06/19 1102

## 2019-08-05 NOTE — ED Triage Notes (Signed)
C/o chest pain x 3 days with cramping in her legs , states she is dizzy today and her blood pressure was elevated last pm

## 2019-08-16 ENCOUNTER — Encounter: Payer: Self-pay | Admitting: Nurse Practitioner

## 2019-08-16 ENCOUNTER — Ambulatory Visit (INDEPENDENT_AMBULATORY_CARE_PROVIDER_SITE_OTHER): Payer: Self-pay | Admitting: Nurse Practitioner

## 2019-08-16 ENCOUNTER — Inpatient Hospital Stay: Payer: No Typology Code available for payment source

## 2019-08-16 DIAGNOSIS — Z1211 Encounter for screening for malignant neoplasm of colon: Secondary | ICD-10-CM

## 2019-08-16 DIAGNOSIS — E871 Hypo-osmolality and hyponatremia: Secondary | ICD-10-CM

## 2019-08-16 DIAGNOSIS — R7989 Other specified abnormal findings of blood chemistry: Secondary | ICD-10-CM

## 2019-08-16 DIAGNOSIS — I10 Essential (primary) hypertension: Secondary | ICD-10-CM

## 2019-08-16 MED ORDER — LOSARTAN POTASSIUM 100 MG PO TABS
100.0000 mg | ORAL_TABLET | Freq: Every day | ORAL | 0 refills | Status: DC
Start: 2019-08-16 — End: 2019-12-15

## 2019-08-16 MED FILL — LOSARTAN POTASSIUM 100 MG T: 100 | 30 days supply | Qty: 30 | Fill #0

## 2019-08-16 NOTE — Progress Notes (Signed)
Virtual Visit via Telephone Note Due to national recommendations of social distancing due to [place], telehealth visit is felt to be most appropriate for this patient at this time.  I discussed the limitations, risks, security and privacy concerns of performing an evaluation and management service by telephone and the availability of in person appointments. I also discussed with the patient that there may be a patient responsible charge related to this service. The patient expressed understanding and agreed to proceed.    I connected with Merlene Morse on 08/16/19  at   8:30 AM EDT  EDT by telephone and verified that I am speaking with the correct person using two identifiers.   Consent I discussed the limitations, risks, security and privacy concerns of performing an evaluation and management service by telephone and the availability of in person appointments. I also discussed with the patient that there may be a patient responsible charge related to this service. The patient expressed understanding and agreed to proceed.   Location of Patient: Private  Residence   Location of Provider: [facility] [employer] Office    Persons participating in Telemedicine visit: [name] FNP-BC [place]    History of Present Illness: Telemedicine visit for: Establish Care   has a past medical history of GERD (gastroesophageal reflux disease), Hypertension, and Iron deficiency.  Essential Hypertension She does monitor her blood pressure at home. Last reading 149/82 which was last night. States blood pressure readings are usually higher than this.  Current medications are hydrochlorothiazide 25 mg and losartan 50 mg daily however she has been out of hydrochlorothiazide.  We will not resume HCTZ at this time as she has recent lab results with hyponatremia and hypokalemia.  Will increase losartan to 100 mg daily at this time. Denies chest pain, shortness of  breath, palpitations, lightheadedness, dizziness, headaches or BLE edema.  BP Readings from Last 3 Encounters:  08/05/19 (!) 142/89  04/13/19 (!) 211/83  02/16/19 (!) 209/74    Last mammogram 2013. She was referred to the breast clinic today for scheduling.  She had FIT test 2018 which she reports was negative.  I am unable to locate any lab reports regarding this.  Elevated TSH. Maternal Aunt has thyroid disorder.  Will repeat thyroid panel in 3 weeks. She denies any goiters, neck fullness or masses.  Lab Results  Component Value Date   TSH 6.506 (H) 08/05/2019    Past Medical History:  Diagnosis Date  . GERD (gastroesophageal reflux disease)   . Hypertension    chronic  . Iron deficiency     Past Surgical History:  Procedure Laterality Date  . ABDOMINAL HYSTERECTOMY    . LAPAROSCOPY      Family History  Problem Relation Age of Onset  . Cancer Other   . Diabetes Other   . Hypertension Other     Social History   Socioeconomic History  . Marital status: Single    Spouse name: Not on file  . Number of children: Not on file  . Years of education: Not on file  . Highest education level: Not on file  Occupational History    Employer: UNEMPLOYED    Comment: Unemployed  Social Needs  . Financial resource strain: Not on file  . Food insecurity    Worry: Not on file    Inability: Not on file  . Transportation needs    Medical: Not on file    Non-medical: Not on file  Tobacco Use  . Smoking status: Current Every Day Smoker    Packs/day: 1.00    Types: Cigarettes  . Smokeless tobacco: Never Used  Substance and Sexual Activity  . Alcohol use: Yes    Alcohol/week: 0.0 standard drinks    Comment: 2 beers per day  . Drug use: No  . Sexual activity: Not on file  Lifestyle  . Physical activity    Days per week: Not on file    Minutes per session: Not on file  . Stress: Not on file  Relationships  . Social Herbalist on phone: Not on file    Gets  together: Not on file    Attends religious service: Not on file    Active member of club or organization: Not on file    Attends meetings of clubs or organizations: Not on file    Relationship status: Not on file  Other Topics Concern  . Not on file  Social History Narrative  . Not on file     Observations/Objective: Awake, alert and oriented x 3   Review of Systems  Constitutional: Negative for fever, malaise/fatigue and weight loss.  HENT: Negative.  Negative for nosebleeds.   Eyes: Negative.  Negative for blurred vision, double vision and photophobia.  Respiratory: Negative.  Negative for cough and shortness of breath.   Cardiovascular: Negative.  Negative for chest pain, palpitations and leg swelling.  Gastrointestinal: Negative.  Negative for heartburn, nausea and vomiting.  Musculoskeletal: Negative.  Negative for myalgias.  Neurological: Negative.  Negative for dizziness, focal weakness, seizures and headaches.  Psychiatric/Behavioral: Negative.  Negative for suicidal ideas.    Assessment and Plan: Josephene was seen today for follow-up.  Diagnoses and all orders for this visit:  Essential hypertension -     losartan (COZAAR) 100 MG tablet; Take 1 tablet (100 mg total) by mouth daily. Continue all antihypertensives as prescribed.  Remember to bring in your blood pressure log with you for your follow up appointment.  DASH/Mediterranean Diets are healthier choices for HTN.    Elevated TSH -     Thyroid Profile; Future  Hyponatremia -     CMP14+EGFR; Future  Colon cancer screening -     Fecal occult blood, imunochemical; Future     Follow Up Instructions Return in about 3 weeks (around 09/06/2019) for BP recheck, TSH, FIT.     I discussed the assessment and treatment plan with the patient. The patient was provided an opportunity to ask questions and all were answered. The patient agreed with the plan and demonstrated an understanding of the instructions.   The  patient was advised to call back or seek an in-person evaluation if the symptoms worsen or if the condition fails to improve as anticipated.  I provided 23 minutes of non-face-to-face time during this encounter including median intraservice time, reviewing previous notes, labs, imaging, medications and explaining diagnosis and management.  Gildardo Pounds, FNP-BC

## 2019-08-16 NOTE — Progress Notes (Signed)
ED follow up.  States that chest pain has resolved. Has finished the potassium supplement.  To her knowledge no prior issue with thyroid.

## 2019-09-11 ENCOUNTER — Ambulatory Visit: Payer: Self-pay | Admitting: Podiatry

## 2019-09-12 ENCOUNTER — Other Ambulatory Visit: Payer: Self-pay

## 2019-09-12 ENCOUNTER — Ambulatory Visit: Payer: Self-pay | Attending: Nurse Practitioner | Admitting: Nurse Practitioner

## 2019-09-12 ENCOUNTER — Ambulatory Visit (HOSPITAL_BASED_OUTPATIENT_CLINIC_OR_DEPARTMENT_OTHER): Payer: Self-pay | Admitting: Pharmacist

## 2019-09-12 ENCOUNTER — Encounter: Payer: Self-pay | Admitting: Nurse Practitioner

## 2019-09-12 VITALS — BP 205/78 | HR 74 | Temp 98.3°F | Resp 16 | Wt 116.4 lb

## 2019-09-12 DIAGNOSIS — I10 Essential (primary) hypertension: Secondary | ICD-10-CM

## 2019-09-12 DIAGNOSIS — R7989 Other specified abnormal findings of blood chemistry: Secondary | ICD-10-CM

## 2019-09-12 DIAGNOSIS — Z1211 Encounter for screening for malignant neoplasm of colon: Secondary | ICD-10-CM

## 2019-09-12 DIAGNOSIS — Z23 Encounter for immunization: Secondary | ICD-10-CM

## 2019-09-12 DIAGNOSIS — E871 Hypo-osmolality and hyponatremia: Secondary | ICD-10-CM

## 2019-09-12 MED ORDER — AMLODIPINE BESYLATE 10 MG PO TABS: 10.0000 mg | ORAL_TABLET | Freq: Every day | ORAL | 3 refills | Status: DC

## 2019-09-12 MED ORDER — CLONIDINE HCL 0.1 MG PO TABS
0.1000 mg | ORAL_TABLET | Freq: Once | ORAL | Status: AC
  Administered 2019-09-12: 0.1 mg via ORAL

## 2019-09-12 MED FILL — AMLODIPINE BESYLATE 10 MG T: 10 | 30 days supply | Qty: 30 | Fill #0

## 2019-09-12 NOTE — Addendum Note (Signed)
Addended by: Jackelyn Knife on: 09/12/2019 09:13 AM   Modules accepted: Orders

## 2019-09-12 NOTE — Patient Instructions (Signed)
Influenza Virus Vaccine injection (Fluarix) What is this medicine? INFLUENZA VIRUS VACCINE (in floo EN zuh VAHY ruhs vak SEEN) helps to reduce the risk of getting influenza also known as the flu. This medicine may be used for other purposes; ask your health care provider or pharmacist if you have questions. COMMON BRAND NAME(S): Fluarix, Fluzone What should I tell my health care provider before I take this medicine? They need to know if you have any of these conditions:  bleeding disorder like hemophilia  fever or infection  Guillain-Barre syndrome or other neurological problems  immune system problems  infection with the human immunodeficiency virus (HIV) or AIDS  low blood platelet counts  multiple sclerosis  an unusual or allergic reaction to influenza virus vaccine, eggs, chicken proteins, latex, gentamicin, other medicines, foods, dyes or preservatives  pregnant or trying to get pregnant  breast-feeding How should I use this medicine? This vaccine is for injection into a muscle. It is given by a health care professional. A copy of Vaccine Information Statements will be given before each vaccination. Read this sheet carefully each time. The sheet may change frequently. Talk to your pediatrician regarding the use of this medicine in children. Special care may be needed. Overdosage: If you think you have taken too much of this medicine contact a poison control center or emergency room at once. NOTE: This medicine is only for you. Do not share this medicine with others. What if I miss a dose? This does not apply. What may interact with this medicine?  chemotherapy or radiation therapy  medicines that lower your immune system like etanercept, anakinra, infliximab, and adalimumab  medicines that treat or prevent blood clots like warfarin  phenytoin  steroid medicines like prednisone or cortisone  theophylline  vaccines This list may not describe all possible  interactions. Give your health care provider a list of all the medicines, herbs, non-prescription drugs, or dietary supplements you use. Also tell them if you smoke, drink alcohol, or use illegal drugs. Some items may interact with your medicine. What should I watch for while using this medicine? Report any side effects that do not go away within 3 days to your doctor or health care professional. Call your health care provider if any unusual symptoms occur within 6 weeks of receiving this vaccine. You may still catch the flu, but the illness is not usually as bad. You cannot get the flu from the vaccine. The vaccine will not protect against colds or other illnesses that may cause fever. The vaccine is needed every year. What side effects may I notice from receiving this medicine? Side effects that you should report to your doctor or health care professional as soon as possible:  allergic reactions like skin rash, itching or hives, swelling of the face, lips, or tongue Side effects that usually do not require medical attention (report to your doctor or health care professional if they continue or are bothersome):  fever  headache  muscle aches and pains  pain, tenderness, redness, or swelling at site where injected  weak or tired This list may not describe all possible side effects. Call your doctor for medical advice about side effects. You may report side effects to FDA at 1-800-FDA-1088. Where should I keep my medicine? This vaccine is only given in a clinic, pharmacy, doctor's office, or other health care setting and will not be stored at home. NOTE: This sheet is a summary. It may not cover all possible information. If you have questions   about this medicine, talk to your doctor, pharmacist, or health care provider.  2020 Elsevier/Gold Standard (2008-05-23 09:30:40)  

## 2019-09-12 NOTE — Progress Notes (Signed)
Assessment & Plan:  Kaylee Allison was seen today for hypertension.  Diagnoses and all orders for this visit:  Essential hypertension -     amLODipine (NORVASC) 10 MG tablet; Take 1 tablet (10 mg total) by mouth daily. Continue losartan 100 mg daily as prescribed.  Remember to bring in your blood pressure log with you for your follow up appointment.  DASH/Mediterranean Diets are healthier choices for HTN.    Patient has been counseled on age-appropriate routine health concerns for screening and prevention. These are reviewed and up-to-date. Referrals have been placed accordingly. Immunizations are up-to-date or declined.    Subjective:   Chief Complaint  Patient presents with  . Hypertension   HPI Kaylee Allison 48 y.o. female presents to office today for follow up.  has a past medical history of GERD (gastroesophageal reflux disease), Hypertension, and Iron deficiency.   Heard home with FIT test today. Referred to [facility] for mammogram scheduling   Essential Hypertension Blood pressure is not well controlled today.  She required 0.1 mg of clonidine here in the office.  She is medication compliant with losartan 100 mg daily however she does continue to still smoke and is not ready to quit.  She checks her blood pressure at home with average readings 140-160s/80s. Denies chest pain, shortness of breath, palpitations, or BLE edema.  She does endorse dizziness and headaches when blood pressure is elevated. BP Readings from Last 3 Encounters:  09/12/19 (!) 207/74  08/05/19 (!) 142/89  04/13/19 (!) 211/83    Review of Systems  Constitutional: Negative for fever, malaise/fatigue and weight loss.  HENT: Negative.  Negative for nosebleeds.   Eyes: Negative.  Negative for blurred vision, double vision and photophobia.  Respiratory: Negative.  Negative for cough and shortness of breath.   Cardiovascular: Negative.  Negative for chest pain, palpitations and leg swelling.   Gastrointestinal: Negative.  Negative for heartburn, nausea and vomiting.  Musculoskeletal: Negative.  Negative for myalgias.  Neurological: Negative.  Negative for dizziness, focal weakness, seizures and headaches.  Psychiatric/Behavioral: Negative.  Negative for suicidal ideas.    Past Medical History:  Diagnosis Date  . GERD (gastroesophageal reflux disease)   . Hypertension    chronic  . Iron deficiency     Past Surgical History:  Procedure Laterality Date  . ABDOMINAL HYSTERECTOMY    . LAPAROSCOPY      Family History  Problem Relation Age of Onset  . Cancer Other   . Diabetes Other   . Hypertension Other     Social History Reviewed with no changes to be made today.   Outpatient Medications Prior to Visit  Medication Sig Dispense Refill  . aspirin EC 81 MG tablet Take 81 mg by mouth daily.    Marland Kitchen losartan (COZAAR) 100 MG tablet Take 1 tablet (100 mg total) by mouth daily. 90 tablet 0  . Multiple Vitamins-Minerals (MULTIVITAMIN PO) Take 1 tablet by mouth daily.     No facility-administered medications prior to visit.     Allergies  Allergen Reactions  . Codeine Nausea And Vomiting       Objective:    BP (!) 207/74   Pulse 74   Temp 98.3 F (36.8 C) (Oral)   Resp 16   Wt 116 lb 6.4 oz (52.8 kg)   SpO2 100%   BMI 19.08 kg/m  Wt Readings from Last 3 Encounters:  09/12/19 116 lb 6.4 oz (52.8 kg)  08/05/19 116 lb (52.6 kg)  05/07/16 108  lb 4 oz (49.1 kg)    Physical Exam Vitals signs and nursing note reviewed.  Constitutional:      Appearance: She is well-developed.  HENT:     Head: Normocephalic and atraumatic.  Neck:     Musculoskeletal: Normal range of motion.  Cardiovascular:     Rate and Rhythm: Normal rate and regular rhythm.     Heart sounds: Normal heart sounds. No murmur. No friction rub. No gallop.   Pulmonary:     Effort: Pulmonary effort is normal. No tachypnea or respiratory distress.     Breath sounds: Normal breath sounds. No  decreased breath sounds, wheezing, rhonchi or rales.  Chest:     Chest wall: No tenderness.  Abdominal:     General: Bowel sounds are normal.     Palpations: Abdomen is soft.  Musculoskeletal: Normal range of motion.  Skin:    General: Skin is warm and dry.  Neurological:     Mental Status: She is alert and oriented to person, place, and time.     Coordination: Coordination normal.  Psychiatric:        Behavior: Behavior normal. Behavior is cooperative.        Thought Content: Thought content normal.        Judgment: Judgment normal.          Patient has been counseled extensively about nutrition and exercise as well as the importance of adherence with medications and regular follow-up. The patient was given clear instructions to go to ER or return to medical center if symptoms don't improve, worsen or new problems develop. The patient verbalized understanding.   Follow-up: Return in about 2 weeks (around 09/26/2019) for BP recheck with LUKE then 3 months with me.   Gildardo Pounds, FNP-BC [facility] and [facility], [place]   09/12/2019, 9:03 AM

## 2019-09-12 NOTE — Addendum Note (Signed)
Addended bySteffanie Dunn on: 09/12/2019 09:26 AM   Modules accepted: Orders

## 2019-09-12 NOTE — Progress Notes (Signed)
Patient presents for vaccination against influenza per orders of Zelda. Consent given. Counseling provided. No contraindications exists. Vaccine administered without incident.   

## 2019-09-13 LAB — CMP14+EGFR
ALT: 24 IU/L (ref 0–32)
AST: 29 IU/L (ref 0–40)
Albumin/Globulin Ratio: 1.8 (ref 1.2–2.2)
Albumin: 4.7 g/dL (ref 3.8–4.8)
Alkaline Phosphatase: 116 IU/L (ref 39–117)
BUN/Creatinine Ratio: 16 (ref 9–23)
BUN: 11 mg/dL (ref 6–24)
Bilirubin Total: 0.5 mg/dL (ref 0.0–1.2)
CO2: 23 mmol/L (ref 20–29)
Calcium: 10.4 mg/dL — ABNORMAL HIGH (ref 8.7–10.2)
Chloride: 102 mmol/L (ref 96–106)
Creatinine, Ser: 0.69 mg/dL (ref 0.57–1.00)
GFR calc Af Amer: 117 mL/min/{1.73_m2} (ref >59)
GFR calc non Af Amer: 102 mL/min/{1.73_m2} (ref >59)
Globulin, Total: 2.6 g/dL (ref 1.5–4.5)
Glucose: 96 mg/dL (ref 65–99)
Potassium: 4.4 mmol/L (ref 3.5–5.2)
Sodium: 139 mmol/L (ref 134–144)
Total Protein: 7.3 g/dL (ref 6.0–8.5)

## 2019-09-13 LAB — THYROID PANEL
Free Thyroxine Index: 1.5 (ref 1.2–4.9)
T3 Uptake Ratio: 28 % (ref 24–39)
T4, Total: 5.2 ug/dL (ref 4.5–12.0)

## 2019-09-15 ENCOUNTER — Other Ambulatory Visit (HOSPITAL_COMMUNITY): Payer: Self-pay | Admitting: *Deleted

## 2019-09-15 ENCOUNTER — Ambulatory Visit: Payer: No Typology Code available for payment source | Admitting: Nurse Practitioner

## 2019-09-15 DIAGNOSIS — Z1231 Encounter for screening mammogram for malignant neoplasm of breast: Secondary | ICD-10-CM

## 2019-09-22 ENCOUNTER — Telehealth (INDEPENDENT_AMBULATORY_CARE_PROVIDER_SITE_OTHER): Payer: Self-pay

## 2019-09-22 NOTE — Telephone Encounter (Signed)
-----   Message from Carylon Perches, Clarinda sent at 09/18/2019  8:11 AM EST -----  ----- Message ----- From: Gildardo Pounds, NP Sent: 09/16/2019   9:23 PM EST To: Carylon Perches, CMA  Thyroid levels are normal. Potassium, sodium, kidney and liver function are normal.

## 2019-09-22 NOTE — Telephone Encounter (Signed)
Patient verified date of birth. She is aware that thyroid levels, potassium, sodium, kidney, and liver function are normal. Nat Christen, CMA

## 2019-09-26 ENCOUNTER — Ambulatory Visit: Payer: Self-pay | Attending: Nurse Practitioner | Admitting: Pharmacist

## 2019-09-26 ENCOUNTER — Other Ambulatory Visit: Payer: Self-pay

## 2019-09-26 ENCOUNTER — Encounter: Payer: Self-pay | Admitting: Pharmacist

## 2019-09-26 VITALS — BP 154/81 | HR 91

## 2019-09-26 DIAGNOSIS — I10 Essential (primary) hypertension: Secondary | ICD-10-CM

## 2019-09-26 NOTE — Progress Notes (Signed)
   S:    PCP: Zelda   Patient arrives in good spirits.    Presents to the clinic for hypertension evaluation, counseling, and management.  Patient was referred and last seen by Primary Care Provider on 09/12/19. BP 205/78 at that visit. Clonidine administered. Amlodipine started.     Patient reports adherence with medications. Takes her medications ~5 AM each morning (works overnight).  Denies chest pain, dyspnea, blurred vision. Denies BLE edema. Does endorse neuopathic pain in her feet (sees doctor tomorrow for this).   Current BP Medications include:  Amlodipine 10 mg daily, losartan 100 mg daily   Dietary habits include: has eliminated salt; uses Mrs. Dash; denies drinking caffeine  Exercise habits include: denies  Family / Social history:  - FHx: DM, HTN - Tobacco: 1 PPD current every day smoker  - Alcohol: 2-3 beers daily   O:  Vitals:   09/26/19 1000  BP: (!) 154/81  Pulse: 91    Home BP readings:  - Endorses 140s-160s SBPs at home (147/70s at work this morning)  Last 3 Office BP readings: BP Readings from Last 3 Encounters:  09/12/19 (!) 205/78  08/05/19 (!) 142/89  04/13/19 (!) 211/83   BMET    Component Value Date/Time   NA 139 09/12/2019 0930   K 4.4 09/12/2019 0930   CL 102 09/12/2019 0930   CO2 23 09/12/2019 0930   GLUCOSE 96 09/12/2019 0930   GLUCOSE 160 (H) 08/05/2019 0941   BUN 11 09/12/2019 0930   CREATININE 0.69 09/12/2019 0930   CALCIUM 10.4 (H) 09/12/2019 0930   GFRNONAA 102 09/12/2019 0930   GFRAA 117 09/12/2019 0930    Renal function: CrCl cannot be calculated (Unknown ideal weight.).  Clinical ASCVD: No  The ASCVD Risk score Mikey Bussing DC Jr., et al., 2013) failed to calculate for the following reasons:   The valid systolic blood pressure range is 90 to 200 mmHg   Cannot find a previous HDL lab   Cannot find a previous total cholesterol lab  A/P: Hypertension longstanding currently uncontrolled on current medications. BP Goal = <130/80  mmHg. Patient is adherent to her medications and recently started the amlodipine. Her SBP has improved down from 205 noted at previous visit. Recommend to continue current regimen along with lifestyle for now and follow-up in 1 month.  -Continued current regimen.  -Counseled on lifestyle modifications for blood pressure control including reduced dietary sodium, increased exercise, adequate sleep  Results reviewed and written information provided.   Total time in face-to-face counseling 30 minutes.   F/U Clinic Visit in 1 month.    Patient seen with: Lesle Reek, PharmD Candidate [facility] [employer] Class of 2022  Benard Halsted, PharmD, [place] [phone]

## 2019-09-26 NOTE — Patient Instructions (Signed)
Thank you for coming to see us today.   Blood pressure today is improving.  Continue taking blood pressure medications as prescribed.   Limiting salt and caffeine, as well as exercising as able for at least 30 minutes for 5 days out of the week, can also help you lower your blood pressure.  Take your blood pressure at home if you are able. Please write down these numbers and bring them to your visits.  If you have any questions about medications, please call me [phone].  Kaylee Allison  

## 2019-09-27 ENCOUNTER — Ambulatory Visit: Payer: Self-pay | Admitting: Podiatry

## 2019-10-17 LAB — FECAL OCCULT BLOOD, IMMUNOCHEMICAL

## 2019-10-18 ENCOUNTER — Ambulatory Visit: Payer: Self-pay | Admitting: Podiatry

## 2019-10-18 MED FILL — LOSARTAN POTASSIUM 100 MG T: 100 | 30 days supply | Qty: 30 | Fill #1

## 2019-10-18 MED FILL — AMLODIPINE BESYLATE 10 MG T: 10 | 30 days supply | Qty: 30 | Fill #1

## 2019-10-26 ENCOUNTER — Ambulatory Visit: Payer: Self-pay | Admitting: Pharmacist

## 2019-10-26 NOTE — Progress Notes (Deleted)
   S:    PCP: Zelda   Patient arrives in good spirits.    Presents to the clinic for hypertension evaluation, counseling, and management.  Patient was referred and last seen by Primary Care Provider on 09/12/19. I saw her on 09/26/19 and BP was 154/81 (down from 205/78 at her previous visit). She had just started amlodipine at a PCP visit before seeing me, so we made the decision to continue her current regimen and follow-up with me today.   Patient *** adherence with medications.   *** chest pain, dyspnea, blurred vision. *** BLE edema.  . Current BP Medications include:  Amlodipine 10 mg daily, losartan 100 mg daily   Dietary habits include: has eliminated salt; uses Mrs. Dash; denies drinking caffeine  Exercise habits include: denies  Family / Social history:  - FHx: DM, HTN - Tobacco: 1 PPD current every day smoker  - Alcohol: 2-3 beers daily   O:  There were no vitals filed for this visit.  Home BP readings:  - ***  Last 3 Office BP readings: BP Readings from Last 3 Encounters:  09/26/19 (!) 154/81  09/12/19 (!) 205/78  08/05/19 (!) 142/89   BMET    Component Value Date/Time   NA 139 09/12/2019 0930   K 4.4 09/12/2019 0930   CL 102 09/12/2019 0930   CO2 23 09/12/2019 0930   GLUCOSE 96 09/12/2019 0930   GLUCOSE 160 (H) 08/05/2019 0941   BUN 11 09/12/2019 0930   CREATININE 0.69 09/12/2019 0930   CALCIUM 10.4 (H) 09/12/2019 0930   GFRNONAA 102 09/12/2019 0930   GFRAA 117 09/12/2019 0930    Renal function: CrCl cannot be calculated (Patient's most recent lab result is older than the maximum 21 days allowed.).  Clinical ASCVD: No  The ASCVD Risk score Mikey Bussing DC Jr., et al., 2013) failed to calculate for the following reasons:   Cannot find a previous HDL lab   Cannot find a previous total cholesterol lab  A/P: Hypertension longstanding currently uncontrolled on current medications. BP Goal = <130/80 mmHg. Patient is adherent to her medications and recently  started the amlodipine. Her SBP has improved down from 205 noted at previous visit. Recommend to continue current regimen along with lifestyle for now and follow-up in 1 month.  -Continued current regimen.  -Counseled on lifestyle modifications for blood pressure control including reduced dietary sodium, increased exercise, adequate sleep  Results reviewed and written information provided.   Total time in face-to-face counseling 30 minutes.   F/U Clinic Visit in 1 month.    Patient seen with: Lesle Reek, PharmD Candidate [facility] [employer] Class of 2022  Benard Halsted, PharmD, [place] [phone]

## 2019-10-27 ENCOUNTER — Ambulatory Visit: Payer: Self-pay | Admitting: Pharmacist

## 2019-11-13 ENCOUNTER — Other Ambulatory Visit: Payer: Self-pay

## 2019-11-13 ENCOUNTER — Encounter: Payer: Self-pay | Admitting: Podiatry

## 2019-11-13 ENCOUNTER — Ambulatory Visit: Payer: Managed Care, Other (non HMO) | Admitting: Podiatry

## 2019-11-13 DIAGNOSIS — L989 Disorder of the skin and subcutaneous tissue, unspecified: Secondary | ICD-10-CM | POA: Diagnosis not present

## 2019-11-16 ENCOUNTER — Ambulatory Visit
Admission: RE | Admit: 2019-11-16 | Discharge: 2019-11-16 | Disposition: A | Discharge status: Home / Self Care | Payer: No Typology Code available for payment source | Source: Ambulatory Visit | Attending: Obstetrics and Gynecology | Admitting: Obstetrics and Gynecology

## 2019-11-16 ENCOUNTER — Ambulatory Visit (HOSPITAL_COMMUNITY): Payer: Self-pay

## 2019-11-16 ENCOUNTER — Other Ambulatory Visit: Payer: Self-pay

## 2019-11-16 DIAGNOSIS — Z1231 Encounter for screening mammogram for malignant neoplasm of breast: Secondary | ICD-10-CM

## 2019-11-16 NOTE — Progress Notes (Signed)
   Subjective: 48 y.o. female presenting to the office today for follow up evaluation of recurrent callus lesions noted to the bilateral feet. She reports throbbing pain caused by the calluses that is exacerbated by walking, standing and wearing tennis shoes. She has had them trimmed down in the past which has helped the symptoms. Patient is here for further evaluation and treatment.   Past Medical History:  Diagnosis Date  . GERD (gastroesophageal reflux disease)   . Hypertension    chronic  . Iron deficiency      Objective:  Physical Exam General: Alert and oriented x3 in no acute distress  Dermatology: Hyperkeratotic lesion(s) present on the bilateral feet. Pain on palpation with a central nucleated core noted. Skin is warm, dry and supple bilateral lower extremities. Negative for open lesions or macerations.  Vascular: Palpable pedal pulses bilaterally. No edema or erythema noted. Capillary refill within normal limits.  Neurological: Epicritic and protective threshold grossly intact bilaterally.   Musculoskeletal Exam: Pain on palpation at the keratotic lesion(s) noted. Range of motion within normal limits bilateral. Muscle strength 5/5 in all groups bilateral.  Assessment: 1. 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 Patient evaluated 2. Excisional debridement of keratoic lesion using a chisel blade was performed without incident. Cantharone applied.  3. Dressed area with light dressing. 4. Patient is to return to the clinic in two weeks.   Edrick Kins, DPM [facility]  Dr. Edrick Kins, [address]                                        [place], [place] [address]                Office [phone]  Fax [phone]

## 2019-11-20 MED FILL — AMLODIPINE BESYLATE 10 MG T: 10 | 30 days supply | Qty: 30 | Fill #2

## 2019-11-20 MED FILL — LOSARTAN POTASSIUM 100 MG T: 100 | 30 days supply | Qty: 30 | Fill #2

## 2019-11-27 ENCOUNTER — Ambulatory Visit: Payer: Managed Care, Other (non HMO) | Admitting: Podiatry

## 2019-11-27 ENCOUNTER — Other Ambulatory Visit: Payer: Self-pay

## 2019-11-27 DIAGNOSIS — L989 Disorder of the skin and subcutaneous tissue, unspecified: Secondary | ICD-10-CM | POA: Diagnosis not present

## 2019-11-30 NOTE — Progress Notes (Signed)
   Subjective: 49 y.o. female presenting to the office today for follow up evaluation of recurrent callus lesions noted to the bilateral feet. She reports a stinging sensation with ambulation. She has not done anything at home for treatment. Patient is here for further evaluation and treatment.   Past Medical History:  Diagnosis Date  . GERD (gastroesophageal reflux disease)   . Hypertension    chronic  . Iron deficiency      Objective:  Physical Exam General: Alert and oriented x3 in no acute distress  Dermatology: Hyperkeratotic lesion(s) present on the bilateral feet. Pain on palpation with a central nucleated core noted. Skin is warm, dry and supple bilateral lower extremities. Negative for open lesions or macerations.  Vascular: Palpable pedal pulses bilaterally. No edema or erythema noted. Capillary refill within normal limits.  Neurological: Epicritic and protective threshold grossly intact bilaterally.   Musculoskeletal Exam: Pain on palpation at the keratotic lesion(s) noted. Range of motion within normal limits bilateral. Muscle strength 5/5 in all groups bilateral.  Assessment: 1. 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 Patient evaluated 2. Excisional debridement of keratoic lesion using a chisel blade was performed without incident.  3. Dressed area with light dressing. 4. Recommended OTC Revitaderm urea 40%.  5. Patient is to return to the clinic as needed.   Edrick Kins, DPM [facility]  Dr. Edrick Kins, [place]                                        [place], [place] [address]                Office [phone]  Fax [phone]

## 2019-12-13 ENCOUNTER — Encounter: Payer: Self-pay | Admitting: Nurse Practitioner

## 2019-12-13 ENCOUNTER — Other Ambulatory Visit: Payer: Self-pay

## 2019-12-13 ENCOUNTER — Ambulatory Visit: Payer: Self-pay | Attending: Nurse Practitioner | Admitting: Nurse Practitioner

## 2019-12-13 VITALS — BP 142/78

## 2019-12-13 DIAGNOSIS — F17210 Nicotine dependence, cigarettes, uncomplicated: Secondary | ICD-10-CM

## 2019-12-13 DIAGNOSIS — F17200 Nicotine dependence, unspecified, uncomplicated: Secondary | ICD-10-CM

## 2019-12-13 DIAGNOSIS — I10 Essential (primary) hypertension: Secondary | ICD-10-CM

## 2019-12-13 NOTE — Progress Notes (Signed)
Virtual Visit via Telephone Note Due to national recommendations of social distancing due to [place] 19, telehealth visit is felt to be most appropriate for this patient at this time.  I discussed the limitations, risks, security and privacy concerns of performing an evaluation and management service by telephone and the availability of in person appointments. I also discussed with the patient that there may be a patient responsible charge related to this service. The patient expressed understanding and agreed to proceed.    I connected with Kaylee Allison on 12/13/19  at   8:30 AM EST  EDT by telephone and verified that I am speaking with the correct person using two identifiers.   Consent I discussed the limitations, risks, security and privacy concerns of performing an evaluation and management service by telephone and the availability of in person appointments. I also discussed with the patient that there may be a patient responsible charge related to this service. The patient expressed understanding and agreed to proceed.   Location of Patient: Private Residence   Location of Provider: [address] and [employer] Office    Persons participating in Telemedicine visit: [name] FNP-BC [place]    History of Present Illness: Telemedicine visit for: Follow up  has a past medical history of GERD (gastroesophageal reflux disease), Hypertension, and Iron deficiency.  Essential Hypertension Monitoring blood pressure at home with most recent reading BP 142/78. Taking losartan 100 mg and amlodipine 10 mg daily as prescribed. Denies chest pain, shortness of breath, palpitations, lightheadedness, dizziness, headaches or BLE edema. Tries to adhere to low fat low carb diet. BP Readings from Last 3 Encounters:  12/13/19 (!) 142/78  09/26/19 (!) 154/81  09/12/19 (!) 205/78      Past Medical History:  Diagnosis Date  . GERD (gastroesophageal reflux  disease)   . Hypertension    chronic  . Iron deficiency     Past Surgical History:  Procedure Laterality Date  . ABDOMINAL HYSTERECTOMY    . LAPAROSCOPY      Family History  Problem Relation Age of Onset  . Cancer Other   . Diabetes Other   . Hypertension Other     Social History   Socioeconomic History  . Marital status: Single    Spouse name: Not on file  . Number of children: Not on file  . Years of education: Not on file  . Highest education level: Not on file  Occupational History    Employer: UNEMPLOYED    Comment: Unemployed  Tobacco Use  . Smoking status: Current Every Day Smoker    Packs/day: 1.00    Types: Cigarettes  . Smokeless tobacco: Never Used  Substance and Sexual Activity  . Alcohol use: Yes    Alcohol/week: 0.0 standard drinks    Comment: 2 beers per day  . Drug use: No  . Sexual activity: Not on file  Other Topics Concern  . Not on file  Social History Narrative  . Not on file   Social Determinants of Health   Financial Resource Strain:   . Difficulty of Paying Living Expenses: Not on file  Food Insecurity:   . Worried About Charity fundraiser in the Last Year: Not on file  . Ran Out of Food in the Last Year: Not on file  Transportation Needs:   . Lack of Transportation (Medical): Not on file  . Lack of Transportation (Non-Medical): Not on file  Physical Activity:   . Days of  Exercise per Week: Not on file  . Minutes of Exercise per Session: Not on file  Stress:   . Feeling of Stress : Not on file  Social Connections:   . Frequency of Communication with Friends and Family: Not on file  . Frequency of Social Gatherings with Friends and Family: Not on file  . Attends Religious Services: Not on file  . Active Member of Clubs or Organizations: Not on file  . Attends Archivist Meetings: Not on file  . Marital Status: Not on file     Observations/Objective: Awake, alert and oriented x 3   Review of Systems   Constitutional: Negative for fever, malaise/fatigue and weight loss.  HENT: Negative.  Negative for nosebleeds.   Eyes: Negative.  Negative for blurred vision, double vision and photophobia.  Respiratory: Negative.  Negative for cough and shortness of breath.   Cardiovascular: Negative.  Negative for chest pain, palpitations and leg swelling.  Gastrointestinal: Negative.  Negative for heartburn, nausea and vomiting.  Musculoskeletal: Negative.  Negative for myalgias.  Neurological: Negative.  Negative for dizziness, focal weakness, seizures and headaches.  Psychiatric/Behavioral: Negative.  Negative for suicidal ideas.    Assessment and Plan: Chad was seen today for follow-up.  Diagnoses and all orders for this visit:  Essential hypertension -     losartan (COZAAR) 100 MG tablet; Take 1 tablet (100 mg total) by mouth daily. -     amLODipine (NORVASC) 10 MG tablet; Take 1 tablet (10 mg total) by mouth daily. Continue all antihypertensives as prescribed.  Remember to bring in your blood pressure log with you for your follow up appointment.  DASH/Mediterranean Diets are healthier choices for HTN.    Tobacco dependence [name] was counseled on the dangers of tobacco use, and was advised to quit. Reviewed strategies to maximize success, including removing cigarettes and smoking materials from environment, stress management and support of family/friends as well as pharmacological alternatives including: Wellbutrin, Chantix, Nicotine patch, Nicotine gum or lozenges. Smoking cessation support: smoking cessation hotline: 1-800-QUIT-NOW.  Smoking cessation classes are also available through [facility] and [facility]. Call [phone] or visit our website at [email or web address]   A total of 3 minutes was spent on counseling for smoking cessation and [name] is not ready to quit.     Follow Up Instructions Return in about 3 months (around 03/11/2020) for HTN.     I discussed  the assessment and treatment plan with the patient. The patient was provided an opportunity to ask questions and all were answered. The patient agreed with the plan and demonstrated an understanding of the instructions.   The patient was advised to call back or seek an in-person evaluation if the symptoms worsen or if the condition fails to improve as anticipated.  I provided 13 minutes of non-face-to-face time during this encounter including median intraservice time, reviewing previous notes, labs, imaging, medications and explaining diagnosis and management.  Gildardo Pounds, FNP-BC

## 2019-12-14 ENCOUNTER — Ambulatory Visit: Payer: Self-pay | Attending: Nurse Practitioner

## 2019-12-14 ENCOUNTER — Other Ambulatory Visit: Payer: Self-pay

## 2019-12-14 DIAGNOSIS — I10 Essential (primary) hypertension: Secondary | ICD-10-CM

## 2019-12-14 DIAGNOSIS — Z0000 Encounter for general adult medical examination without abnormal findings: Secondary | ICD-10-CM

## 2019-12-14 MED FILL — AMLODIPINE BESYLATE 10 MG T: 10 | 30 days supply | Qty: 30 | Fill #2

## 2019-12-14 MED FILL — LOSARTAN POTASSIUM 100 MG T: 100 | 30 days supply | Qty: 30 | Fill #2

## 2019-12-15 ENCOUNTER — Encounter: Payer: Self-pay | Admitting: Nurse Practitioner

## 2019-12-15 LAB — LIPID PANEL
Chol/HDL Ratio: 2.9 ratio (ref 0.0–4.4)
Cholesterol, Total: 258 mg/dL — ABNORMAL HIGH (ref 100–199)
HDL: 89 mg/dL (ref >39)
LDL Chol Calc (NIH): 157 mg/dL — ABNORMAL HIGH (ref 0–99)
Triglycerides: 72 mg/dL (ref 0–149)
VLDL Cholesterol Cal: 12 mg/dL (ref 5–40)

## 2019-12-15 LAB — CBC
Hematocrit: 44.2 % (ref 34.0–46.6)
Hemoglobin: 14.6 g/dL (ref 11.1–15.9)
MCH: 30.5 pg (ref 26.6–33.0)
MCHC: 33 g/dL (ref 31.5–35.7)
MCV: 93 fL (ref 79–97)
Platelets: 240 10*3/uL (ref 150–450)
RBC: 4.78 x10E6/uL (ref 3.77–5.28)
RDW: 13.9 % (ref 11.7–15.4)
WBC: 9.5 10*3/uL (ref 3.4–10.8)

## 2019-12-15 LAB — HEMOGLOBIN A1C
Est. average glucose Bld gHb Est-mCnc: 91 mg/dL
Hgb A1c MFr Bld: 4.8 % (ref 4.8–5.6)

## 2019-12-15 LAB — BASIC METABOLIC PANEL
BUN/Creatinine Ratio: 14 (ref 9–23)
BUN: 10 mg/dL (ref 6–24)
CO2: 21 mmol/L (ref 20–29)
Calcium: 10.2 mg/dL (ref 8.7–10.2)
Chloride: 104 mmol/L (ref 96–106)
Creatinine, Ser: 0.7 mg/dL (ref 0.57–1.00)
GFR calc Af Amer: 117 mL/min/{1.73_m2} (ref >59)
GFR calc non Af Amer: 101 mL/min/{1.73_m2} (ref >59)
Glucose: 92 mg/dL (ref 65–99)
Potassium: 4.2 mmol/L (ref 3.5–5.2)
Sodium: 140 mmol/L (ref 134–144)

## 2019-12-15 LAB — VITAMIN D 25 HYDROXY (VIT D DEFICIENCY, FRACTURES): Vit D, 25-Hydroxy: 10.3 ng/mL — ABNORMAL LOW (ref 30.0–100.0)

## 2019-12-15 MED ORDER — ATORVASTATIN CALCIUM 20 MG PO TABS: 20.0000 mg | ORAL_TABLET | Freq: Every day | ORAL | 3 refills | Status: AC

## 2019-12-15 MED ORDER — AMLODIPINE BESYLATE 10 MG PO TABS: 10.0000 mg | ORAL_TABLET | Freq: Every day | ORAL | 0 refills | Status: AC

## 2019-12-15 MED ORDER — LOSARTAN POTASSIUM 100 MG PO TABS: 100.0000 mg | ORAL_TABLET | Freq: Every day | ORAL | 0 refills | Status: AC

## 2019-12-15 MED ORDER — VITAMIN D (ERGOCALCIFEROL) 1.25 MG (50000 UNIT) PO CAPS: 50000.0000 [IU] | ORAL_CAPSULE | ORAL | 1 refills | Status: AC

## 2019-12-18 MED FILL — ATORVASTATIN CALCIUM 20 MG: 20 | 30 days supply | Qty: 30 | Fill #0

## 2019-12-19 MED FILL — VIT D2 1.25 MG (50,000 UNIT: 1.25 MG | 28 days supply | Qty: 4 | Fill #0

## 2020-01-31 ENCOUNTER — Encounter (HOSPITAL_COMMUNITY): Payer: Self-pay | Admitting: Emergency Medicine

## 2020-01-31 ENCOUNTER — Emergency Department (HOSPITAL_COMMUNITY): Payer: Managed Care, Other (non HMO)

## 2020-01-31 ENCOUNTER — Other Ambulatory Visit: Payer: Self-pay

## 2020-01-31 ENCOUNTER — Emergency Department (HOSPITAL_COMMUNITY)
Admission: EM | Admit: 2020-01-31 | Discharge: 2020-01-31 | Disposition: A | Discharge status: Home / Self Care | Payer: Managed Care, Other (non HMO) | Attending: Emergency Medicine | Admitting: Emergency Medicine

## 2020-01-31 DIAGNOSIS — F17210 Nicotine dependence, cigarettes, uncomplicated: Secondary | ICD-10-CM | POA: Diagnosis not present

## 2020-01-31 DIAGNOSIS — R300 Dysuria: Secondary | ICD-10-CM | POA: Insufficient documentation

## 2020-01-31 DIAGNOSIS — Z7982 Long term (current) use of aspirin: Secondary | ICD-10-CM | POA: Diagnosis not present

## 2020-01-31 DIAGNOSIS — Z79899 Other long term (current) drug therapy: Secondary | ICD-10-CM | POA: Diagnosis not present

## 2020-01-31 DIAGNOSIS — I10 Essential (primary) hypertension: Secondary | ICD-10-CM | POA: Insufficient documentation

## 2020-01-31 DIAGNOSIS — R109 Unspecified abdominal pain: Secondary | ICD-10-CM

## 2020-01-31 LAB — BASIC METABOLIC PANEL
Anion gap: 13 (ref 5–15)
BUN: 8 mg/dL (ref 6–20)
CO2: 24 mmol/L (ref 22–32)
Calcium: 9.9 mg/dL (ref 8.9–10.3)
Chloride: 101 mmol/L (ref 98–111)
Creatinine, Ser: 0.72 mg/dL (ref 0.44–1.00)
GFR calc Af Amer: >60 mL/min (ref >60)
GFR calc non Af Amer: >60 mL/min (ref >60)
Glucose, Bld: 116 mg/dL — ABNORMAL HIGH (ref 70–99)
Potassium: 3.7 mmol/L (ref 3.5–5.1)
Sodium: 138 mmol/L (ref 135–145)

## 2020-01-31 LAB — URINALYSIS, ROUTINE W REFLEX MICROSCOPIC
Bacteria, UA: NONE SEEN
Bilirubin Urine: NEGATIVE
Glucose, UA: NEGATIVE mg/dL
Hgb urine dipstick: NEGATIVE
Ketones, ur: 5 mg/dL — AB
Leukocytes,Ua: NEGATIVE
Nitrite: NEGATIVE
Protein, ur: 30 mg/dL — AB
Specific Gravity, Urine: 1.025 (ref 1.005–1.030)
pH: 5 (ref 5.0–8.0)

## 2020-01-31 LAB — I-STAT BETA HCG BLOOD, ED (MC, WL, AP ONLY): I-stat hCG, quantitative: <5 m[IU]/mL (ref <5)

## 2020-01-31 LAB — CBC
HCT: 47.2 % — ABNORMAL HIGH (ref 36.0–46.0)
Hemoglobin: 15.1 g/dL — ABNORMAL HIGH (ref 12.0–15.0)
MCH: 30.6 pg (ref 26.0–34.0)
MCHC: 32 g/dL (ref 30.0–36.0)
MCV: 95.5 fL (ref 80.0–100.0)
Platelets: 256 10*3/uL (ref 150–400)
RBC: 4.94 MIL/uL (ref 3.87–5.11)
RDW: 14.3 % (ref 11.5–15.5)
WBC: 6.4 10*3/uL (ref 4.0–10.5)
nRBC: 0 % (ref 0.0–0.2)

## 2020-01-31 MED ORDER — NAPROXEN 500 MG PO TABS: 500.0000 mg | ORAL_TABLET | Freq: Two times a day (BID) | ORAL | 0 refills | Status: DC

## 2020-01-31 MED ORDER — METHOCARBAMOL 500 MG PO TABS: 500.0000 mg | ORAL_TABLET | Freq: Two times a day (BID) | ORAL | 0 refills | Status: DC

## 2020-01-31 MED ORDER — HYDROCODONE-ACETAMINOPHEN 5-325 MG PO TABS
1.0000 | ORAL_TABLET | Freq: Once | ORAL | Status: AC
  Administered 2020-01-31: 14:00:00 1 via ORAL
  Filled 2020-01-31: qty 1

## 2020-01-31 MED FILL — NAPROXEN 500 MG TABLET: 500 | 15 days supply | Qty: 30 | Fill #0

## 2020-01-31 MED FILL — METHOCARBAMOL 500 MG TABS: 500 | 10 days supply | Qty: 20 | Fill #0

## 2020-01-31 NOTE — ED Triage Notes (Signed)
Pt reports since Tuesday morning has had constant left flank pain that goes under left rib cage. Pt denies any n/v/d.

## 2020-01-31 NOTE — Discharge Instructions (Addendum)
As discussed, your CT scan was negative for kidney stones.  I suspect your pain is related to muscular pain.  I am sending you home with naproxen and Robaxin.  Robaxin is a muscle relaxer which can cause drowsiness and do not drive or operate machinery while on the medication.  If you develop a rash over the next few days, call your PCP for further evaluation of shingles.  If your symptoms do not improve within the next week, follow-up with your PCP for further evaluation.  You may also purchase over-the-counter Lidoderm patches and Voltaren gel for added pain relief.  Return to the ER for new or worsening symptoms.

## 2020-01-31 NOTE — ED Provider Notes (Signed)
[facility] EMERGENCY DEPARTMENT Provider Note   CSN: [id number] Arrival date & time: 01/31/20  1117     History Chief Complaint  Patient presents with  . Flank Pain    Kaylee Allison is a 49 y.o. female with a past medical history significant for GERD, hypertension, and iron deficiency anemia who presents to the ED due to consistent left flank pain x2 days.  Patient notes left flank pain occasionally radiates to the left side of her abdomen.  Pain is worse with movement.  Admits to intermittent dysuria.  Rates her pain a 7/10.  Denies history of kidney stones.  Denies vaginal symptoms.  She is currently sexually active with one partner without protection.  No concerns for STDs at this time.  Denies associated nausea, vomiting, diarrhea.  Denies chest pain and shortness of breath.  No history of blood clots, recent surgeries, recent long immobilizations, and hormonal treatments.  Denies hematuria.  Denies overlying rash.  Denies injury to her flank region.  History obtained from patient and past medical records. No interpreter used during encounter.      Past Medical History:  Diagnosis Date  . GERD (gastroesophageal reflux disease)   . Hypertension    chronic  . Iron deficiency     Patient Active Problem List   Diagnosis Date Noted  . Chest pain 04/02/2011  . Tobacco abuse 04/02/2011    Past Surgical History:  Procedure Laterality Date  . ABDOMINAL HYSTERECTOMY    . LAPAROSCOPY       OB History   No obstetric history on file.     Family History  Problem Relation Age of Onset  . Cancer Other   . Diabetes Other   . Hypertension Other     Social History   Tobacco Use  . Smoking status: Current Every Day Smoker    Packs/day: 1.00    Types: Cigarettes  . Smokeless tobacco: Never Used  Substance Use Topics  . Alcohol use: Yes    Alcohol/week: 0.0 standard drinks    Comment: 2 beers per day  . Drug use: No    Home Medications Prior to  Admission medications   Medication Sig Start Date End Date Taking? Authorizing Provider  amLODipine (NORVASC) 10 MG tablet Take 1 tablet (10 mg total) by mouth daily. 12/15/19   Gildardo Pounds, NP  aspirin EC 81 MG tablet Take 81 mg by mouth daily.    [provider]  atorvastatin (LIPITOR) 20 MG tablet Take 1 tablet (20 mg total) by mouth daily. 12/15/19   Gildardo Pounds, NP  losartan (COZAAR) 100 MG tablet Take 1 tablet (100 mg total) by mouth daily. 12/15/19 03/14/20  Gildardo Pounds, NP  Multiple Vitamins-Minerals (MULTIVITAMIN PO) Take 1 tablet by mouth daily.    [provider]  Vitamin D, Ergocalciferol, (DRISDOL) 1.25 MG (50000 UNIT) CAPS capsule Take 1 capsule (50,000 Units total) by mouth every 7 (seven) days. 12/15/19   Gildardo Pounds, NP    Allergies    Codeine  Review of Systems   Review of Systems  Constitutional: Negative for chills and fever.  Respiratory: Negative for shortness of breath.   Cardiovascular: Negative for chest pain.  Gastrointestinal: Positive for abdominal pain. Negative for diarrhea, nausea and vomiting.  Genitourinary: Positive for dysuria and flank pain. Negative for vaginal bleeding, vaginal discharge and vaginal pain.  Musculoskeletal: Positive for back pain.  Skin: Negative for rash.    Physical Exam Updated Vital  Signs BP (!) 194/73   Pulse 85   Temp 98.3 F (36.8 C) (Oral)   Resp 16   Ht 5\' 6"  (1.676 m)   Wt 52.6 kg   SpO2 100%   BMI 18.72 kg/m   Physical Exam Vitals and nursing note reviewed.  Constitutional:      General: She is not in acute distress.    Appearance: She is not ill-appearing.  HENT:     Head: Normocephalic.  Eyes:     Pupils: Pupils are equal, round, and reactive to light.  Cardiovascular:     Rate and Rhythm: Normal rate and regular rhythm.     Pulses: Normal pulses.     Heart sounds: Normal heart sounds. No murmur. No friction rub. No gallop.   Pulmonary:     Effort: Pulmonary effort is  normal.     Breath sounds: Normal breath sounds.  Abdominal:     General: Abdomen is flat. Bowel sounds are normal. There is no distension.     Palpations: Abdomen is soft.     Tenderness: There is no abdominal tenderness. There is no right CVA tenderness, left CVA tenderness, guarding or rebound.     Comments: Abdomen soft, nondistended, nontender to palpation in all quadrants without guarding or peritoneal signs. No rebound.   Musculoskeletal:     Cervical back: Neck supple.     Comments: No thoracic or lumbar midline tenderness.  No reproducible thoracic or lumbar paraspinal tenderness.  Skin:    Comments: No overlying rash left flank region  Neurological:     General: No focal deficit present.     Mental Status: She is alert.  Psychiatric:        Mood and Affect: Mood normal.        Behavior: Behavior normal.     ED Results / Procedures / Treatments   Labs (all labs ordered are listed, but only abnormal results are displayed) Labs Reviewed  URINALYSIS, ROUTINE W REFLEX MICROSCOPIC - Abnormal; Notable for the following components:      Result Value   APPearance HAZY (*)    Ketones, ur 5 (*)    Protein, ur 30 (*)    All other components within normal limits  BASIC METABOLIC PANEL - Abnormal; Notable for the following components:   Glucose, Bld 116 (*)    All other components within normal limits  CBC - Abnormal; Notable for the following components:   Hemoglobin 15.1 (*)    HCT 47.2 (*)    All other components within normal limits  I-STAT BETA HCG BLOOD, ED ([facility], [facility], [facility] ONLY)    EKG None  Radiology CT Renal Stone Study  Result Date: 01/31/2020 CLINICAL DATA:  Left flank pain for 2 days, extending under the left rib cage. EXAM: CT ABDOMEN AND PELVIS WITHOUT CONTRAST TECHNIQUE: Multidetector CT imaging of the abdomen and pelvis was performed following the standard protocol without IV contrast. COMPARISON:  None. FINDINGS: Lower chest: Minor linear scarring or atelectasis  in the anterior right lung base. Lung bases otherwise clear. Heart normal in size. Hepatobiliary: No focal liver abnormality is seen. No gallstones, gallbladder wall thickening, or biliary dilatation. Pancreas: Unremarkable. No pancreatic ductal dilatation or surrounding inflammatory changes. Spleen: Normal in size without focal abnormality. Adrenals/Urinary Tract: No adrenal masses. Kidneys normal size, orientation and position. No masses, stones or hydronephrosis. Normal ureters. Normal bladder. Stomach/Bowel: Normal stomach and small bowel. There are multiple colonic diverticula. No evidence of diverticulitis, colonic wall thickening or  other inflammatory process. Normal appendix visualized. Vascular/Lymphatic: Aortic atherosclerotic calcifications. No aneurysm. No enlarged lymph nodes. Reproductive: Status post hysterectomy. No adnexal masses. Other: No abdominal wall hernia or abnormality. No abdominopelvic ascites. Musculoskeletal: No acute or significant osseous findings. IMPRESSION: 1. No acute findings. No findings to account the patient's symptoms. No renal or ureteral stones or obstructive uropathy. 2. Aortic atherosclerosis.  Colonic diverticulosis. Electronically Signed   By: Lajean Manes M.D.   On: 01/31/2020 13:56    Procedures Procedures (including critical care time)  Medications Ordered in ED Medications  HYDROcodone-acetaminophen (NORCO/VICODIN) 5-325 MG per tablet 1 tablet (1 tablet Oral Given 01/31/20 1333)    ED Course  I have reviewed the triage vital signs and the nursing notes.  Pertinent labs & imaging results that were available during my care of the patient were reviewed by me and considered in my medical decision making (see chart for details).    MDM Rules/Calculators/A&P                     49 year old female presents to the ED due to consistent left flank pain x2 days.  Denies history of kidney stones.  Admits to associated dysuria, but denies hematuria.  Denies  direct Injury to back/flank region.  Stable vitals.  Patient is afebrile, not tachycardic or hypoxic.  Patient no acute distress and non-ill-appearing.  Physical exam reassuring.  No overlying rash to suggest shingles.  Abdomen soft, nondistended, nontender.  No concern for acute abdomen at this time.  No thoracic or lumbar midline tenderness or paraspinal tenderness.  Will obtain renal study to rule out kidney stone.  If negative, will treat as MSK etiology given pain is worse with movement.  Given pain is lower down in the flank region, doubt PE.  BMP reassuring with normal renal function and no electrolyte derangements.  UA negative for signs of infection or hematuria, but mild ketonuria and proteinuria.  CBC reassuring with no leukocytosis. CT scan personally reviewed which demonstrates:   IMPRESSION:  1. No acute findings. No findings to account the patient's symptoms.  No renal or ureteral stones or obstructive uropathy.  2. Aortic atherosclerosis. Colonic diverticulosis.   Will discharge patient with symptomatic treatment of naproxen and Robaxin for MSK related pain. Instructed patient that Robaxin can cause drowsiness do not drive or operate machinery on the medication.  Instructed patient to follow-up with PCP if symptoms do not improve within the next week. Strict ED precautions discussed with patient. Patient states understanding and agrees to plan. Patient discharged home in no acute distress and stable vitals.  Final Clinical Impression(s) / ED Diagnoses Final diagnoses:  Flank pain    Rx / DC Orders ED Discharge Orders    None       Karie Kirks 01/31/20 1433    Drenda Freeze, MD 01/31/20 [phone]

## 2020-01-31 NOTE — ED Notes (Signed)
Pt discharged at this time. Discharge instructions reviewed, opportunity to ask questions provided, pt verbalizes understanding of discharge instructions.   

## 2020-01-31 NOTE — ED Notes (Signed)
Pt transported to CT via stretcher at this time.  

## 2020-02-01 MED FILL — VIT D2 1.25 MG (50,000 UNIT: 1.25 MG | 28 days supply | Qty: 4 | Fill #1

## 2020-02-01 MED FILL — LOSARTAN POTASSIUM 100 MG T: 100 | 30 days supply | Qty: 30 | Fill #0

## 2020-02-01 MED FILL — AMLODIPINE BESYLATE 10 MG T: 10 | 30 days supply | Qty: 30 | Fill #3

## 2020-03-12 ENCOUNTER — Ambulatory Visit: Payer: No Typology Code available for payment source | Admitting: Nurse Practitioner

## 2020-08-16 ENCOUNTER — Other Ambulatory Visit: Payer: No Typology Code available for payment source

## 2020-08-16 DIAGNOSIS — Z20822 Contact with and (suspected) exposure to covid-19: Secondary | ICD-10-CM

## 2020-08-17 LAB — NOVEL CORONAVIRUS, NAA: SARS-CoV-2, NAA: NOT DETECTED

## 2020-08-17 LAB — SARS-COV-2, NAA 2 DAY TAT

## 2020-08-27 ENCOUNTER — Ambulatory Visit: Payer: Managed Care, Other (non HMO) | Attending: Internal Medicine

## 2020-08-27 DIAGNOSIS — Z23 Encounter for immunization: Secondary | ICD-10-CM

## 2020-08-27 NOTE — Progress Notes (Signed)
   Covid-19 Vaccination Clinic  Name:  DYAMOND TOLOSA    MRN: [id number] DOB: July 28, 1971  08/27/2020  Ms. Winzer was observed post Covid-19 immunization for 15 minutes without incident. She was provided with Vaccine Information Sheet and instruction to access the V-Safe system.   Ms. Arca was instructed to call 911 with any severe reactions post vaccine: Marland Kitchen Difficulty breathing  . Swelling of face and throat  . A fast heartbeat  . A bad rash all over body  . Dizziness and weakness   Immunizations Administered    Name Date Dose VIS Date Route   Moderna COVID-19 Vaccine 08/27/2020 10:50 AM 0.5 mL 10/2019 Intramuscular   Manufacturer: Moderna   Lot: [id number]   [place]: 19509-326-71

## 2020-09-24 ENCOUNTER — Ambulatory Visit: Payer: Managed Care, Other (non HMO) | Attending: Internal Medicine

## 2020-09-24 DIAGNOSIS — Z23 Encounter for immunization: Secondary | ICD-10-CM

## 2020-09-24 NOTE — Progress Notes (Signed)
   Covid-19 Vaccination Clinic  Name:  Kaylee Allison    MRN: [id number] DOB: 1971/08/07  09/24/2020  [name] was observed post Covid-19 immunization for 15 minutes without incident. She was provided with Vaccine Information Sheet and instruction to access the V-Safe system.   [name] was instructed to call 911 with any severe reactions post vaccine: Marland Kitchen Difficulty breathing  . Swelling of face and throat  . A fast heartbeat  . A bad rash all over body  . Dizziness and weakness   Immunizations Administered    No immunizations on file.

## 2020-12-25 ENCOUNTER — Ambulatory Visit (INDEPENDENT_AMBULATORY_CARE_PROVIDER_SITE_OTHER): Payer: Managed Care, Other (non HMO) | Admitting: Podiatry

## 2020-12-25 ENCOUNTER — Other Ambulatory Visit: Payer: Self-pay

## 2020-12-25 DIAGNOSIS — M79672 Pain in left foot: Secondary | ICD-10-CM | POA: Diagnosis not present

## 2020-12-25 DIAGNOSIS — L989 Disorder of the skin and subcutaneous tissue, unspecified: Secondary | ICD-10-CM

## 2020-12-25 DIAGNOSIS — M79671 Pain in right foot: Secondary | ICD-10-CM | POA: Diagnosis not present

## 2020-12-30 ENCOUNTER — Other Ambulatory Visit: Payer: Self-pay | Admitting: Internal Medicine

## 2020-12-30 DIAGNOSIS — Z1231 Encounter for screening mammogram for malignant neoplasm of breast: Secondary | ICD-10-CM

## 2021-01-11 NOTE — Progress Notes (Signed)
   Subjective: 50 y.o. female presenting to the office today for follow up evaluation of recurrent callus lesions noted to the bilateral feet. She reports a stinging sensation with ambulation. She has not done anything at home for treatment. Patient is here for further evaluation and treatment.   Past Medical History:  Diagnosis Date  . GERD (gastroesophageal reflux disease)   . Hypertension    chronic  . Iron deficiency      Objective:  Physical Exam General: Alert and oriented x3 in no acute distress  Dermatology: Hyperkeratotic lesion(s) present on the bilateral feet. Pain on palpation with a central nucleated core noted. Skin is warm, dry and supple bilateral lower extremities. Negative for open lesions or macerations.  Vascular: Palpable pedal pulses bilaterally. No edema or erythema noted. Capillary refill within normal limits.  Neurological: Epicritic and protective threshold grossly intact bilaterally.   Musculoskeletal Exam: Pain on palpation at the keratotic lesion(s) noted. Range of motion within normal limits bilateral. Muscle strength 5/5 in all groups bilateral.  Assessment: 1. 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 Patient evaluated 2. Excisional debridement of keratoic lesion using a chisel blade was performed without incident.  3. Dressed area with light dressing. 4. Recommended OTC Revitaderm urea 40%.  5. Patient is to return to the clinic as needed.   Edrick Kins, DPM [facility]  Dr. Edrick Kins, [address]                                        [place], [place] [address]                Office [phone]  Fax [phone]

## 2021-01-27 ENCOUNTER — Other Ambulatory Visit: Payer: Self-pay

## 2021-01-27 ENCOUNTER — Ambulatory Visit (INDEPENDENT_AMBULATORY_CARE_PROVIDER_SITE_OTHER): Payer: Managed Care, Other (non HMO) | Admitting: Podiatry

## 2021-01-27 DIAGNOSIS — M79672 Pain in left foot: Secondary | ICD-10-CM | POA: Diagnosis not present

## 2021-01-27 DIAGNOSIS — L989 Disorder of the skin and subcutaneous tissue, unspecified: Secondary | ICD-10-CM

## 2021-01-27 DIAGNOSIS — M79671 Pain in right foot: Secondary | ICD-10-CM | POA: Diagnosis not present

## 2021-01-27 NOTE — Progress Notes (Signed)
   Subjective: 50 y.o. female presenting to the office today for follow up evaluation of recurrent callus lesions noted to the bilateral feet.  Patient states that there is some improvement.  She has been applying the Dr. Felicie Morn corn callus remover with improvement.  No new complaints at this time  Past Medical History:  Diagnosis Date  . GERD (gastroesophageal reflux disease)   . Hypertension    chronic  . Iron deficiency      Objective:  Physical Exam General: Alert and oriented x3 in no acute distress  Dermatology: Hyperkeratotic lesion(s) present on the bilateral feet subfirst and fifth MTPJ. Pain on palpation with a central nucleated core noted. Skin is warm, dry and supple bilateral lower extremities. Negative for open lesions or macerations.  Vascular: Palpable pedal pulses bilaterally. No edema or erythema noted. Capillary refill within normal limits.  Neurological: Epicritic and protective threshold grossly intact bilaterally.   Musculoskeletal Exam: Pain on palpation at the keratotic lesion(s) noted. Range of motion within normal limits bilateral. Muscle strength 5/5 in all groups bilateral.  Assessment: 1. 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 Patient evaluated 2. Excisional debridement of keratoic lesion using a chisel blade was performed without incident.  3. Dressed area with light dressing. 4.  Continue Dr. Felicie Morn corn callus remover with salicylic acid pads since they seem to be helping significantly 5. Patient is to return to the clinic as needed.   Edrick Kins, DPM [facility]  Dr. Edrick Kins, DPM    [address], [place] [address]                Office [phone]  Fax [phone]

## 2021-02-03 ENCOUNTER — Ambulatory Visit: Payer: No Typology Code available for payment source

## 2021-03-19 ENCOUNTER — Ambulatory Visit: Payer: No Typology Code available for payment source

## 2021-03-30 ENCOUNTER — Encounter (HOSPITAL_COMMUNITY): Payer: Self-pay

## 2021-03-30 ENCOUNTER — Other Ambulatory Visit: Payer: Self-pay

## 2021-03-30 ENCOUNTER — Emergency Department (HOSPITAL_COMMUNITY)
Admission: EM | Admit: 2021-03-30 | Discharge: 2021-03-31 | Disposition: A | Discharge status: Home / Self Care | Payer: Managed Care, Other (non HMO) | Attending: Emergency Medicine | Admitting: Emergency Medicine

## 2021-03-30 ENCOUNTER — Emergency Department (HOSPITAL_COMMUNITY): Payer: Managed Care, Other (non HMO)

## 2021-03-30 DIAGNOSIS — Z7982 Long term (current) use of aspirin: Secondary | ICD-10-CM | POA: Insufficient documentation

## 2021-03-30 DIAGNOSIS — F17210 Nicotine dependence, cigarettes, uncomplicated: Secondary | ICD-10-CM | POA: Insufficient documentation

## 2021-03-30 DIAGNOSIS — W108XXA Fall (on) (from) other stairs and steps, initial encounter: Secondary | ICD-10-CM | POA: Insufficient documentation

## 2021-03-30 DIAGNOSIS — M25552 Pain in left hip: Secondary | ICD-10-CM | POA: Diagnosis not present

## 2021-03-30 DIAGNOSIS — Y92009 Unspecified place in unspecified non-institutional (private) residence as the place of occurrence of the external cause: Secondary | ICD-10-CM | POA: Diagnosis not present

## 2021-03-30 DIAGNOSIS — S8392XA Sprain of unspecified site of left knee, initial encounter: Secondary | ICD-10-CM | POA: Insufficient documentation

## 2021-03-30 DIAGNOSIS — I10 Essential (primary) hypertension: Secondary | ICD-10-CM | POA: Diagnosis not present

## 2021-03-30 DIAGNOSIS — Z79899 Other long term (current) drug therapy: Secondary | ICD-10-CM | POA: Insufficient documentation

## 2021-03-30 DIAGNOSIS — S8992XA Unspecified injury of left lower leg, initial encounter: Secondary | ICD-10-CM | POA: Diagnosis present

## 2021-03-30 NOTE — ED Triage Notes (Signed)
Left knee pain and left hip pain from fall. Denies hitting head. Not on thinners. ETOH on board.

## 2021-03-30 NOTE — ED Notes (Signed)
Pt. Had high blood pressure, RN notified

## 2021-03-30 NOTE — ED Provider Notes (Signed)
Emergency Medicine Provider Triage Evaluation Note  Kaylee Allison , a 50 y.o. female  was evaluated in triage.  Pt complains of left knee and hip pain after a mechanical fall. Patient states she was walking up stairs, tripped, and hit her knee on the stair causing her to slide down 4 stairs. No head injury or LOC. She takes ASA 81mg , but not other blood thinners. Denies associated numbness/tingling. No other complaints.   Review of Systems  Positive: arthralgia Negative: numbness  Physical Exam  BP (!) 165/79 (BP Location: Right Arm)   Pulse 99   Temp 99.2 F (37.3 C) (Oral)   Resp 16   Ht 5\' 6"  (1.676 m)   Wt 52.6 kg   SpO2 100%   BMI 18.72 kg/m  Gen:   Awake, no distress   Resp:  Normal effort  MSK:   Moves extremities without difficulty  Other:  Bony tenderness over left knee and hip. No midline cervical, thoracic, or lumbar midline tenderness.  Medical Decision Making  Medically screening exam initiated at 9:21 PM.  Appropriate orders placed.  Merlene Morse was informed that the remainder of the evaluation will be completed by another provider, this initial triage assessment does not replace that evaluation, and the importance of remaining in the ED until their evaluation is complete.  X-rays to rule out bony fractures.    Karie Kirks 16/10/96 0454    Delora Fuel, MD 09/81/19 [phone]

## 2021-03-31 MED ORDER — IBUPROFEN 400 MG PO TABS
400.0000 mg | ORAL_TABLET | Freq: Once | ORAL | Status: AC
  Administered 2021-03-31: 400 mg via ORAL
  Filled 2021-03-31: qty 1

## 2021-03-31 MED ORDER — ACETAMINOPHEN 325 MG PO TABS
650.0000 mg | ORAL_TABLET | Freq: Once | ORAL | Status: AC
  Administered 2021-03-31: 650 mg via ORAL
  Filled 2021-03-31: qty 2

## 2021-03-31 NOTE — ED Notes (Signed)
Patient discharge instructions reviewed with the patient. The patient verbalized understanding. Patient discharged.

## 2021-03-31 NOTE — ED Provider Notes (Signed)
[facility] EMERGENCY DEPARTMENT Provider Note   CSN: [id number] Arrival date & time: 03/30/21  2110     History Chief Complaint  Patient presents with  . Knee Pain  . Hip Pain    Kaylee Allison is a 50 y.o. female.  The history is provided by the patient.  Knee Pain Hip Pain  She has history of hypertension, GERD and comes in after tripping going up some steps and injuring her left hip and knee.  She thinks her left knee hit some brick.  She has been unable to walk on it since the fall.  She denies head, neck, back injury and denies any other extremity injury.   Past Medical History:  Diagnosis Date  . GERD (gastroesophageal reflux disease)   . Hypertension    chronic  . Iron deficiency     Patient Active Problem List   Diagnosis Date Noted  . Chest pain 04/02/2011  . Tobacco abuse 04/02/2011    Past Surgical History:  Procedure Laterality Date  . ABDOMINAL HYSTERECTOMY    . LAPAROSCOPY       OB History   No obstetric history on file.     Family History  Problem Relation Age of Onset  . Cancer Other   . Diabetes Other   . Hypertension Other     Social History   Tobacco Use  . Smoking status: Current Every Day Smoker    Packs/day: 1.00    Types: Cigarettes  . Smokeless tobacco: Never Used  Substance Use Topics  . Alcohol use: Yes    Alcohol/week: 0.0 standard drinks    Comment: 2 beers per day  . Drug use: No    Home Medications Prior to Admission medications   Medication Sig Start Date End Date Taking? Authorizing Provider  amLODipine (NORVASC) 10 MG tablet Take 1 tablet (10 mg total) by mouth daily. 12/15/19   Gildardo Pounds, NP  aspirin EC 81 MG tablet Take 81 mg by mouth daily.    [provider]  atorvastatin (LIPITOR) 20 MG tablet Take 1 tablet (20 mg total) by mouth daily. 12/15/19   Gildardo Pounds, NP  losartan (COZAAR) 100 MG tablet Take 1 tablet (100 mg total) by mouth daily. 12/15/19 03/14/20  Gildardo Pounds, NP  methocarbamol (ROBAXIN) 500 MG tablet Take 1 tablet (500 mg total) by mouth 2 (two) times daily. 01/31/20   Suzy Bouchard, PA-C  Multiple Vitamins-Minerals (MULTIVITAMIN PO) Take 1 tablet by mouth daily.    [provider]  naproxen (NAPROSYN) 500 MG tablet Take 1 tablet (500 mg total) by mouth 2 (two) times daily. 01/31/20   Suzy Bouchard, PA-C  Vitamin D, Ergocalciferol, (DRISDOL) 1.25 MG (50000 UNIT) CAPS capsule Take 1 capsule (50,000 Units total) by mouth every 7 (seven) days. 12/15/19   Gildardo Pounds, NP    Allergies    Codeine  Review of Systems   Review of Systems  All other systems reviewed and are negative.   Physical Exam Updated Vital Signs BP (!) 184/74 (BP Location: Left Arm)   Pulse 71   Temp 98.7 F (37.1 C) (Oral)   Resp 18   Ht 5\' 6"  (1.676 m)   Wt 52.6 kg   SpO2 100%   BMI 18.72 kg/m   Physical Exam Vitals and nursing note reviewed.   50 year old female, resting comfortably and in no acute distress. Vital signs are significant for elevated blood pressure. Oxygen  saturation is 100%, which is normal. Head is normocephalic and atraumatic. PERRLA, EOMI. Oropharynx is clear. Neck is nontender and supple without adenopathy or JVD. Back is nontender and there is no CVA tenderness. Lungs are clear without rales, wheezes, or rhonchi. Chest is nontender. Heart has regular rate and rhythm without murmur. Abdomen is soft, flat, nontender without masses or hepatosplenomegaly and peristalsis is normoactive. Extremities: There is a small effusion present in the left knee.  Left knee is tender rather diffusely.  There is no instability on valgus or varus stress.  Lachman and McMurray's tests are negative.  However, there is pain with virtually any movement of the left knee.  No pain elicited with passive range of motion of the left hip and no tenderness palpation of the left hip. Skin is warm and dry without rash. Neurologic: Mental status  is normal, cranial nerves are intact, there are no motor or sensory deficits.  ED Results / Procedures / Treatments    Radiology DG Knee Complete 4 Views Left  Result Date: 03/30/2021 CLINICAL DATA:  Fall, left knee pain EXAM: LEFT KNEE - COMPLETE 4+ VIEW COMPARISON:  None. FINDINGS: No evidence of fracture, dislocation, or joint effusion. No evidence of arthropathy or other focal bone abnormality. Soft tissues are unremarkable. IMPRESSION: Negative. Electronically Signed   By: Fidela Salisbury MD   On: 03/30/2021 22:35   DG Hip Unilat W or Wo Pelvis 2-3 Views Left  Result Date: 03/30/2021 CLINICAL DATA:  Fall with left lower extremity pain EXAM: DG HIP (WITH OR WITHOUT PELVIS) 2-3V LEFT COMPARISON:  None. FINDINGS: There is no evidence of hip fracture or dislocation. There is no evidence of arthropathy or other focal bone abnormality. IMPRESSION: Negative. Electronically Signed   By: Ulyses Jarred M.D.   On: 03/30/2021 22:38    Procedures Procedures   Medications Ordered in ED Medications  ibuprofen (ADVIL) tablet 400 mg (has no administration in time range)  acetaminophen (TYLENOL) tablet 650 mg (has no administration in time range)    ED Course  I have reviewed the triage vital signs and the nursing notes.  Pertinent imaging results that were available during my care of the patient were reviewed by me and considered in my medical decision making (see chart for details).   MDM Rules/Calculators/A&P                         Fall with injury to left knee.  Left knee and left hip x-rays have been ordered at triage showing no evidence of fracture.  She will be placed in a knee immobilizer for comfort and will give crutches to use as needed.  Advised on ice and elevation, told to use over-the-counter NSAIDs and acetaminophen for pain.  She is referred to orthopedics for follow-up.  Old records are reviewed, and she has no relevant past visits.  Final Clinical Impression(s) / ED  Diagnoses Final diagnoses:  Fall at home, initial encounter  Sprain of left knee, initial encounter    Rx / DC Orders ED Discharge Orders    None       Delora Fuel, MD 11/10/70 [phone]

## 2021-03-31 NOTE — Discharge Instructions (Addendum)
Apply ice for 30 minutes at a time, 4 times a day.  Wear the knee immobilizer as needed.  Use crutches as needed,  Take ibuprofen or naproxen as needed for pain.  If you need additional pain relief, you may add acetaminophen.  Combining ibuprofen or naproxen with acetaminophen gives you additional pain relief compared with either one by itself.

## 2022-03-20 ENCOUNTER — Encounter: Payer: Self-pay | Admitting: Internal Medicine

## 2022-03-30 ENCOUNTER — Encounter: Payer: Self-pay | Admitting: Physician Assistant

## 2022-04-21 ENCOUNTER — Ambulatory Visit (INDEPENDENT_AMBULATORY_CARE_PROVIDER_SITE_OTHER): Payer: Managed Care, Other (non HMO) | Admitting: Physician Assistant

## 2022-04-21 ENCOUNTER — Encounter: Payer: Self-pay | Admitting: Physician Assistant

## 2022-04-21 VITALS — BP 142/78 | HR 105 | Ht 65.0 in | Wt 113.0 lb

## 2022-04-21 DIAGNOSIS — R7989 Other specified abnormal findings of blood chemistry: Secondary | ICD-10-CM

## 2022-04-21 DIAGNOSIS — Z800 Family history of malignant neoplasm of digestive organs: Secondary | ICD-10-CM

## 2022-04-21 DIAGNOSIS — F1011 Alcohol abuse, in remission: Secondary | ICD-10-CM

## 2022-04-21 MED ORDER — NA SULFATE-K SULFATE-MG SULF 17.5-3.13-1.6 GM/177ML PO SOLN: 1.0000 | Freq: Once | ORAL | 0 refills | Status: AC

## 2022-04-21 NOTE — Progress Notes (Signed)
Chief Complaint: Discuss colonoscopy and elevated LFTs  HPI:    Kaylee Allison is a 51 year old female who was referred to me by Pa, [facility] for a complaint of elevated LFTs and to discuss a colonoscopy.      11/25/21 ALT 78, AST 104 and otherwise normal CMP and CBC.    Today, the patient tells me that she was a heavy alcohol abuser 8 years ago as she was going through various transitions in her life, but has stopped drinking so heavily and now has about 2 beers every day.  Does tell me her birthday was yesterday though so she drank quite a bit more than normal.    Describes that she has a family history of colon cancer in her brother diagnosed at the age of 5 with rectal cancer in her sister who passed away at the age of 39 from colon cancer, also some cancer history on her dad side of the family.    Denies fever, chills, weight loss, blood in her stool, abdominal pain, heartburn, reflux, nausea, vomiting or change in bowel habits.  Past Medical History:  Diagnosis Date   GERD (gastroesophageal reflux disease)    Hypertension    chronic   Iron deficiency     Past Surgical History:  Procedure Laterality Date   ABDOMINAL HYSTERECTOMY     LAPAROSCOPY      Current Outpatient Medications  Medication Sig Dispense Refill   amLODipine (NORVASC) 10 MG tablet Take 1 tablet (10 mg total) by mouth daily. 90 tablet 0   aspirin EC 81 MG tablet Take 81 mg by mouth daily.     atorvastatin (LIPITOR) 20 MG tablet Take 1 tablet (20 mg total) by mouth daily. 90 tablet 3   losartan (COZAAR) 100 MG tablet Take 1 tablet (100 mg total) by mouth daily. 90 tablet 0   methocarbamol (ROBAXIN) 500 MG tablet Take 1 tablet (500 mg total) by mouth 2 (two) times daily. 20 tablet 0   Multiple Vitamins-Minerals (MULTIVITAMIN PO) Take 1 tablet by mouth daily.     naproxen (NAPROSYN) 500 MG tablet Take 1 tablet (500 mg total) by mouth 2 (two) times daily. 30 tablet 0   Vitamin D, Ergocalciferol, (DRISDOL)  1.25 MG (50000 UNIT) CAPS capsule Take 1 capsule (50,000 Units total) by mouth every 7 (seven) days. 12 capsule 1   No current facility-administered medications for this visit.    Allergies as of 04/21/2022 - Review Complete 04/21/2022  Allergen Reaction Noted   Codeine Nausea And Vomiting 04/01/2011    Family History  Problem Relation Age of Onset   Cancer Other    Diabetes Other    Hypertension Other     Social History   Socioeconomic History   Marital status: Single    Spouse name: Not on file   Number of children: Not on file   Years of education: Not on file   Highest education level: Not on file  Occupational History    Employer: UNEMPLOYED    Comment: Unemployed  Tobacco Use   Smoking status: Every Day    Packs/day: 1.00    Types: Cigarettes   Smokeless tobacco: Never  Substance and Sexual Activity   Alcohol use: Yes    Alcohol/week: 0.0 standard drinks of alcohol    Comment: 2 beers per day   Drug use: No   Sexual activity: Not on file  Other Topics Concern   Not on file  Social History Narrative   Not  on file   Social Determinants of Health   Financial Resource Strain: Not on file  Food Insecurity: Not on file  Transportation Needs: Not on file  Physical Activity: Not on file  Stress: Not on file  Social Connections: Not on file  Intimate Partner Violence: Not on file    Review of Systems:    Constitutional: No weight loss, fever or chills Skin: No rash  Cardiovascular: No chest pain Respiratory: No SOB  Gastrointestinal: See HPI and otherwise negative Genitourinary: No dysuria  Neurological: No headache, dizziness or syncope Musculoskeletal: No new muscle or joint pain Hematologic: No bleeding Psychiatric: No history of depression or anxiety   Physical Exam:  Vital signs: BP (!) 142/78   Pulse (!) 105   Ht '5\' 5"'$  (1.651 m)   Wt 113 lb (51.3 kg)   SpO2 99%   BMI 18.80 kg/m    Constitutional:   Pleasant AA female appears to be in  NAD, Well developed, Well nourished, alert and cooperative Head:  Normocephalic and atraumatic. Eyes:   PEERL, EOMI. No icterus. Conjunctiva pink. Ears:  Normal auditory acuity. Neck:  Supple Throat: Oral cavity and pharynx without inflammation, swelling or lesion.  Respiratory: Respirations even and unlabored. Lungs clear to auscultation bilaterally.   No wheezes, crackles, or rhonchi.  Cardiovascular: Normal S1, S2. No MRG. Regular rate and rhythm. No peripheral edema, cyanosis or pallor.  Gastrointestinal:  Soft, nondistended, nontender. No rebound or guarding. Normal bowel sounds. No appreciable masses or hepatomegaly. Rectal:  Not performed.  Msk:  Symmetrical without gross deformities. Without edema, no deformity or joint abnormality.  Neurologic:  Alert and  oriented x4;  grossly normal neurologically.  Skin:   Dry and intact without significant lesions or rashes. Psychiatric: Demonstrates good judgement and reason without abnormal affect or behaviors.  See HPI for recent labs.  Assessment: 1.  Family history of colon cancer: In her sister who passed away at the age of 76 from colon cancer and her brother diagnosed with rectal cancer in his 23s 2.  Elevated LFTs: Likely from below 3.  History of alcohol abuse: 8 years ago a heavy alcohol user, now typically 2 beers a day  Plan: 1.  Discussed with patient that likely her LFTs are elevated from her alcohol abuse/use and possibly fatty liver.  She would like to have a month to abstain or severely decreased her alcohol intake to see what her liver enzymes look like then.  We went ahead and ordered a repeat hepatic function panel for a month from now.  If her liver enzymes are still elevated then we will need to do a full liver work-up including ultrasound and other serologies.  She verbalized understanding. 2.  Patient was scheduled for a colonoscopy in the [facility] with Dr. Hilarie Fredrickson due to a family history of colon cancer.  Did provide the  patient a detailed list of risks for the procedure and she agrees to proceed. Patient is appropriate for endoscopic procedure(s) in the ambulatory ([facility]) setting.  3.  Patient follow in clinic per recommendations after time of colonoscopy/labs.  Kaylee Newer, PA-C [facility] 04/21/2022, 9:28 AM  Cc: [facility]

## 2022-04-21 NOTE — Patient Instructions (Addendum)
If you are age 53 or younger, your body mass index should be between 19-25. Your Body mass index is 18.8 kg/m. If this is out of the aformentioned range listed, please consider follow up with your Primary Care Provider.  ________________________________________________________  The [facility] providers would like to encourage you to use [facility] to communicate with providers for non-urgent requests or questions.  Due to long hold times on the telephone, sending your provider a message by [facility] may be a faster and more efficient way to get a response.  Please allow 48 business hours for a response.  Please remember that this is for non-urgent requests.  _______________________________________________________  Kaylee Allison have been scheduled for a colonoscopy. Please follow written instructions given to you at your visit today.  Please pick up your prep supplies at the pharmacy within the next 1-3 days. If you use inhalers (even only as needed), please bring them with you on the day of your procedure.  Your provider has requested that you go to the basement level for lab work in 1 month. Press "B" on the elevator. The lab is located at the first door on the left as you exit the elevator.  Follow up pending th results of your Colonoscopy and labs.  Thank you for entrusting me with your care and choosing [facility].  Ellouise Newer, PA-C

## 2022-04-21 NOTE — Progress Notes (Signed)
Addendum: Reviewed and agree with assessment and management plan. Qasim Diveley M, MD  

## 2022-05-21 ENCOUNTER — Other Ambulatory Visit: Payer: Managed Care, Other (non HMO)

## 2022-05-21 DIAGNOSIS — R7989 Other specified abnormal findings of blood chemistry: Secondary | ICD-10-CM

## 2022-05-21 DIAGNOSIS — F1011 Alcohol abuse, in remission: Secondary | ICD-10-CM

## 2022-05-21 LAB — HEPATIC FUNCTION PANEL
ALT: 28 U/L (ref 0–35)
AST: 22 U/L (ref 0–37)
Albumin: 4.2 g/dL (ref 3.5–5.2)
Alkaline Phosphatase: 115 U/L (ref 39–117)
Bilirubin, Direct: 0.1 mg/dL (ref 0.0–0.3)
Total Bilirubin: 0.4 mg/dL (ref 0.2–1.2)
Total Protein: 7.7 g/dL (ref 6.0–8.3)

## 2022-06-01 ENCOUNTER — Encounter: Payer: Self-pay | Admitting: Internal Medicine

## 2022-06-08 ENCOUNTER — Ambulatory Visit: Payer: Managed Care, Other (non HMO) | Admitting: Internal Medicine

## 2022-06-08 ENCOUNTER — Encounter: Payer: Self-pay | Admitting: Internal Medicine

## 2022-06-08 VITALS — BP 147/84 | HR 72 | Temp 97.1°F | Resp 14 | Ht 65.0 in | Wt 113.0 lb

## 2022-06-08 DIAGNOSIS — Z800 Family history of malignant neoplasm of digestive organs: Secondary | ICD-10-CM

## 2022-06-08 DIAGNOSIS — D128 Benign neoplasm of rectum: Secondary | ICD-10-CM

## 2022-06-08 DIAGNOSIS — D124 Benign neoplasm of descending colon: Secondary | ICD-10-CM

## 2022-06-08 DIAGNOSIS — K635 Polyp of colon: Secondary | ICD-10-CM | POA: Diagnosis not present

## 2022-06-08 DIAGNOSIS — R7989 Other specified abnormal findings of blood chemistry: Secondary | ICD-10-CM

## 2022-06-08 DIAGNOSIS — Z1211 Encounter for screening for malignant neoplasm of colon: Secondary | ICD-10-CM | POA: Diagnosis present

## 2022-06-08 DIAGNOSIS — D125 Benign neoplasm of sigmoid colon: Secondary | ICD-10-CM | POA: Diagnosis not present

## 2022-06-08 MED ORDER — SODIUM CHLORIDE 0.9 % IV SOLN: 500.0000 mL | Freq: Once | INTRAVENOUS | Status: DC

## 2022-06-08 NOTE — Progress Notes (Signed)
GASTROENTEROLOGY PROCEDURE H&P NOTE   Primary Care Physician: Pa, [facility]    Reason for Procedure:  Colon cancer in patient with family history of colon cancer, sibling x2  Plan:    Screening colonoscopy  Patient is appropriate for endoscopic procedure(s) in the ambulatory ([place]) setting.  The nature of the procedure, as well as the risks, benefits, and alternatives were carefully and thoroughly reviewed with the patient. Ample time for discussion and questions allowed. The patient understood, was satisfied, and agreed to proceed.     HPI: Kaylee Allison is a 51 y.o. female who presents for screening colonoscopy.  Medical history as below.  Tolerated the prep.  No recent chest pain or shortness of breath.  No abdominal pain today.  Past Medical History:  Diagnosis Date   GERD (gastroesophageal reflux disease)    Hypertension    chronic   Iron deficiency     Past Surgical History:  Procedure Laterality Date   ABDOMINAL HYSTERECTOMY     LAPAROSCOPY      Prior to Admission medications   Medication Sig Start Date End Date Taking? Authorizing Provider  amLODipine (NORVASC) 10 MG tablet Take 1 tablet (10 mg total) by mouth daily. 12/15/19  Yes Gildardo Pounds, NP  aspirin EC 81 MG tablet Take 81 mg by mouth daily.   Yes [provider]  hydrochlorothiazide (HYDRODIURIL) 12.5 MG tablet Take 12.5 mg by mouth every morning. 01/09/22  Yes [provider]  metoprolol succinate (TOPROL-XL) 100 MG 24 hr tablet Take 100 mg by mouth at bedtime. 12/15/21  Yes [provider]  Multiple Vitamins-Minerals (MULTIVITAMIN PO) Take 1 tablet by mouth daily.   Yes [provider]  Vitamin D, Ergocalciferol, (DRISDOL) 1.25 MG (50000 UNIT) CAPS capsule Take 1 capsule (50,000 Units total) by mouth every 7 (seven) days. 12/15/19  Yes Gildardo Pounds, NP  atorvastatin (LIPITOR) 20 MG tablet Take 1 tablet (20 mg total) by mouth daily. Patient not  taking: Reported on 04/21/2022 12/15/19   Gildardo Pounds, NP  losartan (COZAAR) 100 MG tablet Take 1 tablet (100 mg total) by mouth daily. 12/15/19 04/21/22  Gildardo Pounds, NP  methocarbamol (ROBAXIN) 500 MG tablet Take 1 tablet (500 mg total) by mouth 2 (two) times daily. Patient not taking: Reported on 04/21/2022 01/31/20   Suzy Bouchard, PA-C    Current Outpatient Medications  Medication Sig Dispense Refill   amLODipine (NORVASC) 10 MG tablet Take 1 tablet (10 mg total) by mouth daily. 90 tablet 0   aspirin EC 81 MG tablet Take 81 mg by mouth daily.     hydrochlorothiazide (HYDRODIURIL) 12.5 MG tablet Take 12.5 mg by mouth every morning.     metoprolol succinate (TOPROL-XL) 100 MG 24 hr tablet Take 100 mg by mouth at bedtime.     Multiple Vitamins-Minerals (MULTIVITAMIN PO) Take 1 tablet by mouth daily.     Vitamin D, Ergocalciferol, (DRISDOL) 1.25 MG (50000 UNIT) CAPS capsule Take 1 capsule (50,000 Units total) by mouth every 7 (seven) days. 12 capsule 1   atorvastatin (LIPITOR) 20 MG tablet Take 1 tablet (20 mg total) by mouth daily. (Patient not taking: Reported on 04/21/2022) 90 tablet 3   losartan (COZAAR) 100 MG tablet Take 1 tablet (100 mg total) by mouth daily. 90 tablet 0   methocarbamol (ROBAXIN) 500 MG tablet Take 1 tablet (500 mg total) by mouth 2 (two) times daily. (Patient not taking: Reported on 04/21/2022) 20 tablet 0  Current Facility-Administered Medications  Medication Dose Route Frequency Provider Last Rate Last Admin   0.9 %  sodium chloride infusion  500 mL Intravenous Once Durell Lofaso, Lajuan Lines, MD        Allergies as of 06/08/2022 - Review Complete 06/08/2022  Allergen Reaction Noted   Codeine Nausea And Vomiting 04/01/2011    Family History  Problem Relation Age of Onset   Colon cancer Sister    Colon cancer Brother    Cancer Other    Diabetes Other    Hypertension Other    Esophageal cancer Neg Hx    Stomach cancer Neg Hx     Social History    Socioeconomic History   Marital status: Single    Spouse name: Not on file   Number of children: 0   Years of education: Not on file   Highest education level: Not on file  Occupational History    Employer: UNEMPLOYED    Comment: Unemployed  Tobacco Use   Smoking status: Every Day    Packs/day: 1.00    Types: Cigarettes   Smokeless tobacco: Never  Vaping Use   Vaping Use: Never used  Substance and Sexual Activity   Alcohol use: Yes    Alcohol/week: 0.0 standard drinks of alcohol    Comment: 2 beers per day   Drug use: Yes    Types: Marijuana    Comment: occasionally   Sexual activity: Not on file  Other Topics Concern   Not on file  Social History Narrative   Not on file   Social Determinants of Health   Financial Resource Strain: Not on file  Food Insecurity: Not on file  Transportation Needs: Not on file  Physical Activity: Not on file  Stress: Not on file  Social Connections: Not on file  Intimate Partner Violence: Not on file    Physical Exam: Vital signs in last 24 hours: '@BP'$  (!) 149/72   Pulse 86   Temp (!) 97.1 F (36.2 C) (Temporal)   Ht '5\' 5"'$  (1.651 m)   Wt 113 lb (51.3 kg)   SpO2 100%   BMI 18.80 kg/m  GEN: NAD EYE: Sclerae anicteric ENT: MMM CV: Non-tachycardic Pulm: CTA b/l GI: Soft, NT/ND NEURO:  Alert & Oriented x 3   [name], MD [facility]  06/08/2022 2:35 PM

## 2022-06-08 NOTE — Progress Notes (Signed)
Pt's states no medical or surgical changes since previsit or office visit. 

## 2022-06-08 NOTE — Progress Notes (Signed)
Called to room to assist during endoscopic procedure.  Patient ID and intended procedure confirmed with present staff. Received instructions for my participation in the procedure from the performing physician.  

## 2022-06-08 NOTE — Progress Notes (Signed)
PT taken to PACU. Monitors in place. VSS. Report given to RN. 

## 2022-06-08 NOTE — Op Note (Signed)
[place] Patient Name: Kaylee Allison Procedure Date: 06/08/2022 2:43 PM MRN: [id number] Endoscopist: Jerene Bears , MD Age: 51 Referring MD:  Date of Birth: Jan 06, 1971 Gender: Female Account #: [id number] Procedure:                Colonoscopy Indications:              Screening in patient at increased risk: Colorectal                            cancer in brother before age 77, Screening in                            patient at increased risk: Colorectal cancer in                            sister before age 62, This is the patient's first                            colonoscopy Medicines:                Monitored Anesthesia Care Procedure:                Pre-Anesthesia Assessment:                           - Prior to the procedure, a History and Physical                            was performed, and patient medications and                            allergies were reviewed. The patient's tolerance of                            previous anesthesia was also reviewed. The risks                            and benefits of the procedure and the sedation                            options and risks were discussed with the patient.                            All questions were answered, and informed consent                            was obtained. Prior Anticoagulants: The patient has                            taken no previous anticoagulant or antiplatelet                            agents. ASA Grade Assessment: II - A patient with  mild systemic disease. After reviewing the risks                            and benefits, the patient was deemed in                            satisfactory condition to undergo the procedure.                           After obtaining informed consent, the colonoscope                            was passed under direct vision. Throughout the                            procedure, the patient's blood pressure, pulse, and                             oxygen saturations were monitored continuously. The                            0405 PCF-H190TL Slim SB Colonoscope was introduced                            through the anus and advanced to the cecum,                            identified by appendiceal orifice and ileocecal                            valve. The colonoscopy was performed without                            difficulty. The patient tolerated the procedure                            well. The quality of the bowel preparation was                            good. The ileocecal valve, appendiceal orifice, and                            rectum were photographed. Scope In: 9:16:38 PM Scope Out: 3:01:22 PM Scope Withdrawal Time: 0 hours 11 minutes 14 seconds  Total Procedure Duration: 0 hours 14 minutes 41 seconds  Findings:                 The digital rectal exam was normal.                           A 4 mm polyp was found in the descending colon. The                            polyp was sessile. The polyp was removed with a  cold snare. Resection and retrieval were complete.                           A 4 mm polyp was found in the sigmoid colon. The                            polyp was sessile. The polyp was removed with a                            cold snare. Resection and retrieval were complete.                           A 5 mm polyp was found in the rectum. The polyp was                            sessile. The polyp was removed with a cold snare.                            Resection and retrieval were complete.                           Multiple small and large-mouthed diverticula were                            found in the sigmoid colon, descending colon,                            transverse colon, hepatic flexure and ascending                            colon.                           No additional abnormalities were found on                             retroflexion. Complications:            No immediate complications. Estimated Blood Loss:     Estimated blood loss was minimal. Impression:               - One 4 mm polyp in the descending colon, removed                            with a cold snare. Resected and retrieved.                           - One 4 mm polyp in the sigmoid colon, removed with                            a cold snare. Resected and retrieved.                           - One 5 mm polyp in the rectum, removed with  a cold                            snare. Resected and retrieved.                           - Moderate diverticulosis in the sigmoid colon, in                            the descending colon, in the transverse colon, at                            the hepatic flexure and in the ascending colon. Recommendation:           - Patient has a contact number available for                            emergencies. The signs and symptoms of potential                            delayed complications were discussed with the                            patient. Return to normal activities tomorrow.                            Written discharge instructions were provided to the                            patient.                           - Resume previous diet.                           - Continue present medications.                           - Await pathology results.                           - Repeat colonoscopy is recommended for                            surveillance. The colonoscopy date will be                            determined after pathology results from today's                            exam become available for review. Jerene Bears, MD 06/08/2022 3:04:17 PM This report has been signed electronically.

## 2022-06-08 NOTE — Patient Instructions (Signed)
Handouts on polyps and diverticulosis given to you today   YOU HAD AN ENDOSCOPIC PROCEDURE TODAY AT THE [facility]:   Refer to the procedure report that was given to you for any specific questions about what was found during the examination.  If the procedure report does not answer your questions, please call your gastroenterologist to clarify.  If you requested that your care partner not be given the details of your procedure findings, then the procedure report has been included in a sealed envelope for you to review at your convenience later.  YOU SHOULD EXPECT: Some feelings of bloating in the abdomen. Passage of more gas than usual.  Walking can help get rid of the air that was put into your GI tract during the procedure and reduce the bloating. If you had a lower endoscopy (such as a colonoscopy or flexible sigmoidoscopy) you may notice spotting of blood in your stool or on the toilet paper. If you underwent a bowel prep for your procedure, you may not have a normal bowel movement for a few days.  Please Note:  You might notice some irritation and congestion in your nose or some drainage.  This is from the oxygen used during your procedure.  There is no need for concern and it should clear up in a day or so.  SYMPTOMS TO REPORT IMMEDIATELY:  Following lower endoscopy (colonoscopy or flexible sigmoidoscopy):  Excessive amounts of blood in the stool  Significant tenderness or worsening of abdominal pains  Swelling of the abdomen that is new, acute  Fever of 100F or higher  For urgent or emergent issues, a gastroenterologist can be reached at any hour by calling [phone]. Do not use MyChart messaging for urgent concerns.    DIET:  We do recommend a small meal at first, but then you may proceed to your regular diet.  Drink plenty of fluids but you should avoid alcoholic beverages for 24 hours.  ACTIVITY:  You should plan to take it easy for the rest of today and you should  NOT DRIVE or use heavy machinery until tomorrow (because of the sedation medicines used during the test).    FOLLOW UP: Our staff will call the number listed on your records the next business day following your procedure.  We will call around 7:15- 8:00 am to check on you and address any questions or concerns that you may have regarding the information given to you following your procedure. If we do not reach you, we will leave a message.  If you develop any symptoms (ie: fever, flu-like symptoms, shortness of breath, cough etc.) before then, please call [phone].  If you test positive for Covid 19 in the 2 weeks post procedure, please call and report this information to us.    If any biopsies were taken you will be contacted by phone or by letter within the next 1-3 weeks.  Please call us at [phone] if you have not heard about the biopsies in 3 weeks.    SIGNATURES/CONFIDENTIALITY: You and/or your care partner have signed paperwork which will be entered into your electronic medical record.  These signatures attest to the fact that that the information above on your After Visit Summary has been reviewed and is understood.  Full responsibility of the confidentiality of this discharge information lies with you and/or your care-partner.  

## 2022-06-09 ENCOUNTER — Telehealth: Payer: Self-pay | Admitting: *Deleted

## 2022-06-09 NOTE — Telephone Encounter (Signed)
  Follow up Call-     06/08/2022    1:52 PM  Call back number  Post procedure Call Back phone  # [phone]  Permission to leave phone message Yes     Patient questions:  Do you have a fever, pain , or abdominal swelling? No. Pain Score  0 *  Have you tolerated food without any problems? Yes.    Have you been able to return to your normal activities? Yes.    Do you have any questions about your discharge instructions: Diet   No. Medications  No. Follow up visit  No.  Do you have questions or concerns about your Care? No.  Actions: * If pain score is 4 or above: No action needed, pain <4.

## 2022-06-15 ENCOUNTER — Encounter: Payer: Self-pay | Admitting: Internal Medicine

## 2022-09-08 ENCOUNTER — Ambulatory Visit (INDEPENDENT_AMBULATORY_CARE_PROVIDER_SITE_OTHER): Payer: Managed Care, Other (non HMO) | Admitting: Podiatry

## 2022-09-08 DIAGNOSIS — B351 Tinea unguium: Secondary | ICD-10-CM | POA: Diagnosis not present

## 2022-09-08 MED ORDER — CLOTRIMAZOLE-BETAMETHASONE 1-0.05 % EX CREA: 1.0000 | TOPICAL_CREAM | Freq: Two times a day (BID) | CUTANEOUS | 1 refills | Status: DC

## 2022-09-08 MED ORDER — TERBINAFINE HCL 250 MG PO TABS: 250.0000 mg | ORAL_TABLET | Freq: Every day | ORAL | 0 refills | Status: AC

## 2022-09-08 NOTE — Progress Notes (Signed)
   Subjective: 51 y.o. female presenting today for follow-up evaluation of symptomatic calluses to the bilateral feet as well as thick discoloration to her bilateral toenails.  Patient is concerned for fungal nail.  She has tried different OTC topical antifungals with no improvement.  Patient also states that her calluses feel much better whenever they are debrided.  She was last seen in the office about a year ago and debridement was performed and she felt significant improvement further treatment and evaluation  Past Medical History:  Diagnosis Date   GERD (gastroesophageal reflux disease)    Hypertension    chronic   Iron deficiency     Past Surgical History:  Procedure Laterality Date   ABDOMINAL HYSTERECTOMY     LAPAROSCOPY      Allergies  Allergen Reactions   Codeine Nausea And Vomiting    Other reaction(s): Unknown    Objective: Physical Exam General: The patient is alert and oriented x3 in no acute distress.  Dermatology: Hyperkeratotic, discolored, thickened, onychodystrophy noted bilateral toenails. Skin is warm, dry and supple bilateral lower extremities. Negative for open lesions or macerations.  Hyperkeratotic calluses noted with a central nucleated core to the bilateral feet  Vascular: Palpable pedal pulses bilaterally. No edema or erythema noted. Capillary refill within normal limits.  Neurological: Epicritic and protective threshold grossly intact bilaterally.   Musculoskeletal Exam: No pedal deformity noted  Assessment: #1 Onychomycosis of toenails bilateral #2 symptomatic callus lesions bilateral feet #3 tinea pedis bilateral  Plan of Care:  #1 Patient was evaluated. #2  Today we discussed different treatment options including oral, topical, and laser antifungal treatment modalities.  We discussed their efficacies and side effects.  Patient opts for oral antifungal treatment modality #3 prescription for Lamisil 250 mg #90 daily. Pt denies a history of  liver pathology or symptoms.  Last hepatic function panel 05/21/2022 WNL #4  For Lotrisone cream apply 2 times daily #5 excisional debridement of the hyperkeratotic callus tissue was performed today using a 312 scalpel without incident or bleeding and the patient felt relief #6 return to clinic 6 months   Edrick Kins, DPM [facility]  Dr. Edrick Kins, DPM    [address], [place] [address]                Office [phone]  Fax [phone]

## 2023-03-10 ENCOUNTER — Ambulatory Visit: Payer: Managed Care, Other (non HMO) | Admitting: Podiatry

## 2023-08-16 ENCOUNTER — Ambulatory Visit (INDEPENDENT_AMBULATORY_CARE_PROVIDER_SITE_OTHER): Payer: PRIVATE HEALTH INSURANCE | Admitting: Podiatry

## 2023-08-16 ENCOUNTER — Encounter: Payer: Self-pay | Admitting: Podiatry

## 2023-08-16 DIAGNOSIS — D2372 Other benign neoplasm of skin of left lower limb, including hip: Secondary | ICD-10-CM

## 2023-08-16 MED ORDER — CLOTRIMAZOLE-BETAMETHASONE 1-0.05 % EX CREA: 1.0000 | TOPICAL_CREAM | Freq: Two times a day (BID) | CUTANEOUS | 2 refills | Status: AC

## 2023-08-16 NOTE — Progress Notes (Addendum)
   Chief Complaint  Patient presents with   Callouses    Trim calluses-would like some more cream for the calluses and topical for the nail fungus    Subjective: 52 y.o. female presenting to the office today for follow-up evaluation of symptomatic skin lesions to the plantar aspect of the bilateral feet.  Pain with ambulation and pressure.   Past Medical History:  Diagnosis Date   GERD (gastroesophageal reflux disease)    Hypertension    chronic   Iron deficiency     Past Surgical History:  Procedure Laterality Date   ABDOMINAL HYSTERECTOMY     LAPAROSCOPY      Allergies  Allergen Reactions   Codeine Nausea And Vomiting    Other reaction(s): Unknown     Objective:  Physical Exam General: Alert and oriented x3 in no acute distress  Dermatology: Hyperkeratotic lesion(s) present on the bilateral feet. Pain on palpation with a central nucleated core noted. Skin is warm, dry and supple bilateral lower extremities. Negative for open lesions or macerations.  Vascular: Palpable pedal pulses bilaterally. No edema or erythema noted. Capillary refill within normal limits.  Neurological: Grossly intact via light touch  Musculoskeletal Exam: Pain on palpation at the keratotic lesion(s) noted. Range of motion within normal limits bilateral. Muscle strength 5/5 in all groups bilateral.  Assessment: 1.  Symptomatic benign skin lesion   Plan of Care:  -Patient evaluated -Excisional debridement of keratoic lesion(s) using a chisel blade was performed without incident.  -Salicylic acid applied with a bandaid -Recommend OTC salicylic acid daily as needed -Refill prescription for Lotrisone cream.  Patient states that it helps -Return to the clinic PRN.   *Works at [employer] 12-hour shifts  Felecia Shelling, DPM [facility]  Dr. Felecia Shelling, DPM    [address] [place], [place] [address]                 Office [phone]  Fax [phone]

## 2024-09-15 ENCOUNTER — Ambulatory Visit (HOSPITAL_COMMUNITY)
Admission: EM | Admit: 2024-09-15 | Discharge: 2024-09-15 | Disposition: A | Discharge status: Home / Self Care | Payer: Self-pay | Attending: Physician Assistant | Admitting: Physician Assistant

## 2024-09-15 ENCOUNTER — Encounter (HOSPITAL_COMMUNITY): Payer: Self-pay

## 2024-09-15 DIAGNOSIS — K047 Periapical abscess without sinus: Secondary | ICD-10-CM

## 2024-09-15 MED ORDER — CLINDAMYCIN HCL 300 MG PO CAPS: 300.0000 mg | ORAL_CAPSULE | Freq: Three times a day (TID) | ORAL | 0 refills | Status: AC

## 2024-09-15 MED ORDER — HYDROCODONE-ACETAMINOPHEN 5-325 MG PO TABS: 1.0000 | ORAL_TABLET | ORAL | 0 refills | Status: AC | PRN

## 2024-09-15 NOTE — ED Triage Notes (Signed)
 Here with left side facial swelling x 2 days. Patient has not taken any medication for her symptoms.

## 2024-09-15 NOTE — ED Provider Notes (Signed)
 [facility]    CSN: [id number] Arrival date & time: 09/15/24  1131      History   Chief Complaint Chief Complaint  Patient presents with   Oral Swelling    HPI Kaylee Allison is a 53 y.o. female.   Patient complains of swelling to the right side of her face.  Patient reports this began 2 days ago.  Patient reports she has been doing warm salt water gargles.  Patient states she has been drinking through a straw because it hurts to open her mouth.  Patient denies any fever or chills.  The history is provided by the patient. No language interpreter was used.    Past Medical History:  Diagnosis Date   GERD (gastroesophageal reflux disease)    Hypertension    chronic   Iron deficiency     Patient Active Problem List   Diagnosis Date Noted   Chest pain 04/02/2011   Tobacco abuse 04/02/2011    Past Surgical History:  Procedure Laterality Date   ABDOMINAL HYSTERECTOMY     LAPAROSCOPY      OB History   No obstetric history on file.      Home Medications    Prior to Admission medications   Medication Sig Start Date End Date Taking? Authorizing Provider  amLODipine  (NORVASC ) 10 MG tablet Take 1 tablet (10 mg total) by mouth daily. 12/15/19  Yes Fleming, Zelda W, NP  aspirin  EC 81 MG tablet Take 81 mg by mouth daily.   Yes [provider]  atorvastatin  (LIPITOR) 20 MG tablet Take 1 tablet (20 mg total) by mouth daily. 12/15/19  Yes Fleming, Zelda W, NP  clindamycin (CLEOCIN) 300 MG capsule Take 1 capsule (300 mg total) by mouth 3 (three) times daily for 10 days. 09/15/24 09/25/24 Yes Florestine Carmical K, PA-C  clotrimazole -betamethasone  (LOTRISONE ) cream Apply 1 Application topically 2 (two) times daily. 08/16/23  Yes Janit Thresa HERO, DPM  HYDROcodone -acetaminophen  (NORCO/VICODIN) 5-325 MG tablet Take 1 tablet by mouth every 4 (four) hours as needed for up to 3 days. 09/15/24 09/18/24 Yes Alexianna Nachreiner K, PA-C  metoprolol succinate (TOPROL-XL) 100 MG 24 hr  tablet Take 100 mg by mouth at bedtime. 12/15/21  Yes [provider]  Multiple Vitamins-Minerals (MULTIVITAMIN PO) Take 1 tablet by mouth daily.   Yes [provider]  Vitamin D , Ergocalciferol , (DRISDOL ) 1.25 MG (50000 UNIT) CAPS capsule Take 1 capsule (50,000 Units total) by mouth every 7 (seven) days. 12/15/19  Yes Fleming, Zelda W, NP  hydrochlorothiazide  (HYDRODIURIL ) 12.5 MG tablet Take 12.5 mg by mouth every morning. 01/09/22   [provider]  losartan  (COZAAR ) 100 MG tablet Take 1 tablet (100 mg total) by mouth daily. 12/15/19 04/21/22  Fleming, Zelda W, NP  terbinafine  (LAMISIL ) 250 MG tablet Take 1 tablet (250 mg total) by mouth daily. 09/08/22   Janit Thresa HERO, DPM    Family History Family History  Problem Relation Age of Onset   Colon cancer Sister    Colon cancer Brother    Cancer Other    Diabetes Other    Hypertension Other    Esophageal cancer Neg Hx    Stomach cancer Neg Hx     Social History Social History   Tobacco Use   Smoking status: Every Day    Current packs/day: 1.00    Types: Cigarettes   Smokeless tobacco: Never  Vaping Use   Vaping status: Never Used  Substance Use Topics   Alcohol use: Yes  Alcohol/week: 0.0 standard drinks of alcohol    Comment: 2 beers per day   Drug use: Yes    Types: Marijuana    Comment: occasionally     Allergies   Codeine   Review of Systems Review of Systems  All other systems reviewed and are negative.    Physical Exam Triage Vital Signs ED Triage Vitals [09/15/24 1324]  Encounter Vitals Group     BP (!) 197/94     Girls Systolic BP Percentile      Girls Diastolic BP Percentile      Boys Systolic BP Percentile      Boys Diastolic BP Percentile      Pulse Rate 85     Resp 18     Temp 98.7 F (37.1 C)     Temp Source Oral     SpO2 96 %     Weight      Height      Head Circumference      Peak Flow      Pain Score 5     Pain Loc      Pain Education      Exclude from Growth  Chart    No data found.  Updated Vital Signs BP (!) 197/94 (BP Location: Left Arm) Comment: recheck 194/76  Pulse 85   Temp 98.7 F (37.1 C) (Oral)   Resp 18   SpO2 96%   Visual Acuity Right Eye Distance:   Left Eye Distance:   Bilateral Distance:    Right Eye Near:   Left Eye Near:    Bilateral Near:     Physical Exam Vitals reviewed.  Constitutional:      Appearance: Normal appearance.  HENT:     Head:     Comments: Right sided facial swelling, no submental swelling, no neck swelling    Nose: Nose normal.     Mouth/Throat:     Mouth: Mucous membranes are moist.     Comments: Swelling right upper gumline, no obvious abscess Cardiovascular:     Rate and Rhythm: Normal rate.  Pulmonary:     Effort: Pulmonary effort is normal.  Abdominal:     General: Abdomen is flat.  Musculoskeletal:        General: Normal range of motion.     Cervical back: Normal range of motion.  Skin:    General: Skin is warm.  Neurological:     General: No focal deficit present.     Mental Status: She is alert.      UC Treatments / Results  Labs (all labs ordered are listed, but only abnormal results are displayed) Labs Reviewed - No data to display  EKG   Radiology No results found.  Procedures Procedures (including critical care time)  Medications Ordered in UC Medications - No data to display  Initial Impression / Assessment and Plan / UC Course  I have reviewed the triage vital signs and the nursing notes.  Pertinent labs & imaging results that were available during my care of the patient were reviewed by me and considered in my medical decision making (see chart for details).      Final Clinical Impressions(s) / UC Diagnoses   Final diagnoses:  Dental abscess     Discharge Instructions      Return if any problems.    ED Prescriptions     Medication Sig Dispense Auth. Provider   clindamycin (CLEOCIN) 300 MG capsule Take 1 capsule (300 mg total) by  mouth 3 (three) times daily for 10 days. 30 capsule Journii Nierman K, PA-C   HYDROcodone -acetaminophen  (NORCO/VICODIN) 5-325 MG tablet Take 1 tablet by mouth every 4 (four) hours as needed for up to 3 days. 10 tablet Demeco Ducksworth K, PA-C      I have reviewed the PDMP during this encounter. An After Visit Summary was printed and given to the patient.       Flint Sonny POUR, PA-C 09/15/24 1416

## 2024-09-15 NOTE — Discharge Instructions (Addendum)
 Return if any problems.
